# Patient Record
Sex: Female | Born: 1955 | Race: Black or African American | Hispanic: No | Smoking: Current every day smoker
Health system: Southern US, Community
[De-identification: ages and names within clinical notes are randomized; demographics above are authoritative.]

## PROBLEM LIST (undated history)

## (undated) DIAGNOSIS — I10 Essential (primary) hypertension: Secondary | ICD-10-CM

## (undated) DIAGNOSIS — F329 Major depressive disorder, single episode, unspecified: Secondary | ICD-10-CM

## (undated) DIAGNOSIS — I509 Heart failure, unspecified: Secondary | ICD-10-CM

## (undated) DIAGNOSIS — F32A Depression, unspecified: Secondary | ICD-10-CM

## (undated) DIAGNOSIS — E079 Disorder of thyroid, unspecified: Secondary | ICD-10-CM

## (undated) HISTORY — PX: HAND SURGERY: SHX662

---

## 2017-05-08 ENCOUNTER — Emergency Department
Admission: EM | Admit: 2017-05-08 | Discharge: 2017-05-08 | Disposition: A | Discharge status: Home / Self Care | Payer: No Typology Code available for payment source | Attending: Emergency Medicine | Admitting: Emergency Medicine

## 2017-05-08 ENCOUNTER — Emergency Department: Payer: No Typology Code available for payment source

## 2017-05-08 ENCOUNTER — Encounter: Payer: Self-pay | Admitting: Emergency Medicine

## 2017-05-08 DIAGNOSIS — S99912A Unspecified injury of left ankle, initial encounter: Secondary | ICD-10-CM | POA: Diagnosis present

## 2017-05-08 DIAGNOSIS — S93402A Sprain of unspecified ligament of left ankle, initial encounter: Secondary | ICD-10-CM | POA: Insufficient documentation

## 2017-05-08 DIAGNOSIS — Y998 Other external cause status: Secondary | ICD-10-CM | POA: Insufficient documentation

## 2017-05-08 DIAGNOSIS — I10 Essential (primary) hypertension: Secondary | ICD-10-CM | POA: Insufficient documentation

## 2017-05-08 DIAGNOSIS — Y92512 Supermarket, store or market as the place of occurrence of the external cause: Secondary | ICD-10-CM | POA: Insufficient documentation

## 2017-05-08 DIAGNOSIS — Y9301 Activity, walking, marching and hiking: Secondary | ICD-10-CM | POA: Diagnosis not present

## 2017-05-08 DIAGNOSIS — W010XXA Fall on same level from slipping, tripping and stumbling without subsequent striking against object, initial encounter: Secondary | ICD-10-CM | POA: Diagnosis not present

## 2017-05-08 HISTORY — DX: Essential (primary) hypertension: I10

## 2017-05-08 HISTORY — DX: Depression, unspecified: F32.A

## 2017-05-08 HISTORY — DX: Major depressive disorder, single episode, unspecified: F32.9

## 2017-05-08 NOTE — ED Provider Notes (Signed)
[facility] Emergency Department Provider Note  ____________________________________________  Time seen: Approximately 5:50 PM  I have reviewed the triage vital signs and the nursing notes.   HISTORY  Chief Complaint Ankle Injury and Fall    HPI Patricia Schneider is a 61 y.o. female that presents to the emergency department with left ankle pain after falling yesterday. Patient states that she was at [employer] when she stepped on a grape and twisted her ankle. She did not hit her head or lose consciousness. She has been walking normally but with pain on the outside of her left ankle. She denies headache, shortness breath, chest pain, nausea, vomiting, abdominal pain, numbness, tingling.   Past Medical History:  Diagnosis Date  . Depression   . Hypertension     There are no active problems to display for this patient.   No past surgical history on file.  Prior to Admission medications   Not on File    Allergies Codeine  No family history on file.  Social History Social History  Substance Use Topics  . Smoking status: Not on file  . Smokeless tobacco: Not on file  . Alcohol use Not on file     Review of Systems  Constitutional: No fever/chills Cardiovascular: No chest pain. Respiratory: No SOB. Gastrointestinal: No abdominal pain.  No nausea, no vomiting.  Musculoskeletal: Positive for ankle pain. Skin: Negative for rash, abrasions, lacerations, ecchymosis. Neurological: Negative for headaches, numbness or tingling   ____________________________________________   PHYSICAL EXAM:  VITAL SIGNS: ED Triage Vitals [05/08/17 1614]  Enc Vitals Group     BP (!) 139/91     Pulse Rate (!) 101     Resp 18     Temp 99.6 F (37.6 C)     Temp Source Oral     SpO2 97 %     Weight 200 lb (90.7 kg)     Height 5\' 4"  (1.626 m)     Head Circumference      Peak Flow      Pain Score 9     Pain Loc      Pain Edu?      Excl. in GC?       Constitutional: Alert and oriented. Well appearing and in no acute distress. Eyes: Conjunctivae are normal. PERRL. EOMI. Head: Atraumatic. ENT:      Ears:      Nose: No congestion/rhinnorhea.      Mouth/Throat: Mucous membranes are moist.  Neck: No stridor.   Cardiovascular: Normal rate, regular rhythm.  Good peripheral circulation. 2+ dorsalis pedis pulses. Respiratory: Normal respiratory effort without tachypnea or retractions. Lungs CTAB. Good air entry to the bases with no decreased or absent breath sounds. Musculoskeletal: Full range of motion to all extremities. No gross deformities appreciated. Tenderness to palpation with mild swelling over lateral malleolus. Neurologic:  Normal speech and language. No gross focal neurologic deficits are appreciated.  Skin:  Skin is warm, dry and intact. No rash noted.  ____________________________________________   LABS (all labs ordered are listed, but only abnormal results are displayed)  Labs Reviewed - No data to display ____________________________________________  EKG   ____________________________________________  RADIOLOGY Lexine Baton, personally viewed and evaluated these images (plain radiographs) as part of my medical decision making, as well as reviewing the written report by the radiologist.  Dg Ankle Complete Left  Result Date: 05/08/2017 CLINICAL DATA:  Left ankle pain after fall yesterday. EXAM: LEFT ANKLE COMPLETE - 3+ VIEW COMPARISON:  None. FINDINGS: There is no evidence of fracture, dislocation, or joint effusion. There is no evidence of arthropathy or other focal bone abnormality. Soft tissue swelling is seen over the lateral malleolus. IMPRESSION: No fracture or dislocation is noted. Soft tissue swelling seen over lateral malleolus suggesting ligamentous injury. Electronically Signed   By: Lupita RaiderJames  Green Jr, M.D.   On: 05/08/2017 16:52     ____________________________________________    PROCEDURES  Procedure(s) performed:    Procedures    Medications - No data to display   ____________________________________________   INITIAL IMPRESSION / ASSESSMENT AND PLAN / ED COURSE  Pertinent labs & imaging results that were available during my care of the patient were reviewed by me and considered in my medical decision making (see chart for details).  Review of the [place] CSRS was performed in accordance of the [facility] prior to dispensing any controlled drugs.     Patient's diagnosis is consistent with ankle sprain. Vital signs and exam are reassuring. X-ray negative for acute bony abnormalities. She was up walking around the room. Ankle was wrapped and crutches were given. Patient is to follow up with orthopedics as directed. Patient is given ED precautions to return to the ED for any worsening or new symptoms.     ____________________________________________  FINAL CLINICAL IMPRESSION(S) / ED DIAGNOSES  Final diagnoses:  Sprain of left ankle, unspecified ligament, initial encounter      NEW MEDICATIONS STARTED DURING THIS VISIT:  There are no discharge medications for this patient.       This chart was dictated using voice recognition software/Dragon. Despite best efforts to proofread, errors can occur which can change the meaning. Any change was purely unintentional.    Enid DerryWagner, Giomar Gusler, PA-C 05/08/17 2030    Arnaldo NatalMalinda, Paul F, MD 05/08/17 [phone]

## 2017-05-08 NOTE — ED Triage Notes (Signed)
Pt reports [place] on a grape yesterday at [employer] and injured left ankle. Pt ambulatory to triage. Swelling noted to left ankle.

## 2017-05-25 ENCOUNTER — Emergency Department
Admission: EM | Admit: 2017-05-25 | Discharge: 2017-05-25 | Disposition: A | Discharge status: Home / Self Care | Payer: Self-pay | Attending: Emergency Medicine | Admitting: Emergency Medicine

## 2017-05-25 ENCOUNTER — Emergency Department: Payer: Self-pay

## 2017-05-25 ENCOUNTER — Encounter: Payer: Self-pay | Admitting: Emergency Medicine

## 2017-05-25 DIAGNOSIS — W540XXA Bitten by dog, initial encounter: Secondary | ICD-10-CM | POA: Insufficient documentation

## 2017-05-25 DIAGNOSIS — S81851A Open bite, right lower leg, initial encounter: Secondary | ICD-10-CM

## 2017-05-25 DIAGNOSIS — I10 Essential (primary) hypertension: Secondary | ICD-10-CM | POA: Insufficient documentation

## 2017-05-25 DIAGNOSIS — Y999 Unspecified external cause status: Secondary | ICD-10-CM | POA: Insufficient documentation

## 2017-05-25 DIAGNOSIS — S40011A Contusion of right shoulder, initial encounter: Secondary | ICD-10-CM | POA: Insufficient documentation

## 2017-05-25 DIAGNOSIS — F17200 Nicotine dependence, unspecified, uncomplicated: Secondary | ICD-10-CM | POA: Insufficient documentation

## 2017-05-25 DIAGNOSIS — Y929 Unspecified place or not applicable: Secondary | ICD-10-CM | POA: Insufficient documentation

## 2017-05-25 DIAGNOSIS — S7011XA Contusion of right thigh, initial encounter: Secondary | ICD-10-CM | POA: Insufficient documentation

## 2017-05-25 DIAGNOSIS — Y939 Activity, unspecified: Secondary | ICD-10-CM | POA: Insufficient documentation

## 2017-05-25 DIAGNOSIS — S7001XA Contusion of right hip, initial encounter: Secondary | ICD-10-CM | POA: Insufficient documentation

## 2017-05-25 DIAGNOSIS — W010XXA Fall on same level from slipping, tripping and stumbling without subsequent striking against object, initial encounter: Secondary | ICD-10-CM | POA: Insufficient documentation

## 2017-05-25 DIAGNOSIS — Z23 Encounter for immunization: Secondary | ICD-10-CM | POA: Insufficient documentation

## 2017-05-25 MED ORDER — TETANUS-DIPHTH-ACELL PERTUSSIS 5-2.5-18.5 LF-MCG/0.5 IM SUSP
0.5000 mL | Freq: Once | INTRAMUSCULAR | Status: AC
  Administered 2017-05-25: 0.5 mL via INTRAMUSCULAR
  Filled 2017-05-25: qty 0.5

## 2017-05-25 MED ORDER — NAPROXEN 500 MG PO TABS: 500.0000 mg | ORAL_TABLET | Freq: Two times a day (BID) | ORAL | 0 refills | Status: DC

## 2017-05-25 MED ORDER — CEPHALEXIN 500 MG PO CAPS: 500.0000 mg | ORAL_CAPSULE | Freq: Three times a day (TID) | ORAL | 0 refills | Status: DC

## 2017-05-25 NOTE — Discharge Instructions (Signed)
Follow-up with your primary care provider or [facility] if any continued problems with your dog bite or joint pain. Clean area daily with mild soap and water and watch for signs of infection. Begin taking Keflex 500 mg 3 times a day for 7 days to prevent infection and naproxen 500 mg twice a day with food for pain and inflammation. You were given a tetanus booster  today.  We've information on animal bites and watch for signs of infection.

## 2017-05-25 NOTE — ED Triage Notes (Signed)
Pt c/o right shoulder pain after falling 5 days ago.  Pain when raising arm.  Also c/o of some pain in right hip. Ambulatory to first desk without difficulty and walking around lobby while waiting to check in. NAD. No obvious deformity to shoulder.

## 2017-05-25 NOTE — ED Provider Notes (Signed)
[facility] Emergency Department Provider Note  ____________________________________________   First MD Initiated Contact with Patient 05/25/17 [phone]     (approximate)  I have reviewed the triage vital signs and the nursing notes.   HISTORY  Chief Complaint Shoulder Pain   HPI Patricia Schneider is a 61 y.o. female is here with complaint of right shoulder pain and right hip pain after falling proximally 5 days ago when she was bit by a pit bull on her right lower leg. Patient did not report the dog bite at that time. She states that she does not know the people who owns the dog that was told that it was up-to-date on immunizations. She has not taken any over-the-counter medication for pain because "I don't want to be addicted". Patient denies any head injury or loss of consciousness. She is unsure of her last tetanus update. She denies any drainage or redness at the site of her dog bite. Currently she rates her pain as 10 over 10.   Past Medical History:  Diagnosis Date  . Depression   . Hypertension     There are no active problems to display for this patient.   History reviewed. No pertinent surgical history.  Prior to Admission medications   Medication Sig Start Date End Date Taking? Authorizing Provider  cephALEXin (KEFLEX) 500 MG capsule Take 1 capsule (500 mg total) by mouth 3 (three) times daily. 05/25/17   Tommi RumpsSummers, Rhonda L, PA-C  naproxen (NAPROSYN) 500 MG tablet Take 1 tablet (500 mg total) by mouth 2 (two) times daily with a meal. 05/25/17   Tommi RumpsSummers, Rhonda L, PA-C    Allergies Codeine  History reviewed. No pertinent family history.  Social History Social History  Substance Use Topics  . Smoking status: Current Every Day Smoker  . Smokeless tobacco: Never Used  . Alcohol use No    Review of Systems Constitutional: No fever/chills Cardiovascular: Denies chest pain. Respiratory: Denies shortness of breath. Musculoskeletal: Positive  for right shoulder pain, positive for right hip pain Skin: Positive for dog bite right lower leg. Neurological: Negative for headaches, focal weakness or numbness.   ____________________________________________   PHYSICAL EXAM:  VITAL SIGNS: ED Triage Vitals  Enc Vitals Group     BP 05/25/17 0837 (!) 160/108     Pulse Rate 05/25/17 0837 89     Resp 05/25/17 0837 18     Temp 05/25/17 0837 98.3 F (36.8 C)     Temp Source 05/25/17 0837 Oral     SpO2 05/25/17 0837 97 %     Weight 05/25/17 0835 200 lb (90.7 kg)     Height 05/25/17 0835 5\' 4"  (1.626 m)     Head Circumference --      Peak Flow --      Pain Score 05/25/17 0835 10     Pain Loc --      Pain Edu? --      Excl. in GC? --     Constitutional: Alert and oriented. Well appearing and in no acute distress. Eyes: Conjunctivae are normal.  Head: Atraumatic. Neck: No stridor.   Cardiovascular: Normal rate, regular rhythm. Grossly normal heart sounds.  Good peripheral circulation. Respiratory: Normal respiratory effort.  No retractions. Lungs CTAB. Musculoskeletal: On examination the right shoulder there is no gross deformity. No soft tissue swelling is appreciated. Range of motion there is no crepitus better range of motion is slightly restricted secondary to discomfort. No ecchymosis or abrasions were seen. There is  generalized tenderness both anterior and posterior aspect. Nontender elbow and forearm. On examination of the right hip there is generalized tenderness on palpation of the hip and proximal femur. Range of motion is without restriction. No gross deformity was noted. There is no tenderness or edema appreciated of the knee.  Neurologic:  Normal speech and language. No gross focal neurologic deficits are appreciated. No gait instability. Skin:  Skin is warm, dry.  Posterior calf there are 2 puncture marks without any evidence of infection. There is some ecchymosis surrounding the area. No evidence of foreign body  noted. Psychiatric: Mood and affect are normal. Speech and behavior are normal.  ____________________________________________   LABS (all labs ordered are listed, but only abnormal results are displayed)  Labs Reviewed - No data to display  RADIOLOGY  Dg Shoulder Right  Result Date: 05/25/2017 CLINICAL DATA:  Right shoulder pain. Fall 3 days ago. Initial encounter. EXAM: RIGHT SHOULDER - 2+ VIEW COMPARISON:  None. FINDINGS: No acute fracture or dislocation is identified. There is mild acromioclavicular joint osteoarthrosis. Minimal glenohumeral marginal spurring is also noted. No significant soft tissue abnormalities are seen. IMPRESSION: Mild degenerative changes without evidence of acute osseous abnormality. Electronically Signed   By: Sebastian Ache M.D.   On: 05/25/2017 10:12   Dg Hip Unilat W Or Wo Pelvis 2-3 Views Right  Result Date: 05/25/2017 CLINICAL DATA:  Fall.  Pain EXAM: DG HIP (WITH OR WITHOUT PELVIS) 2-3V RIGHT COMPARISON:  None. FINDINGS: Mild degenerative changes in the hips bilaterally with joint space narrowing and spurring, right slightly greater than left. SI joints are symmetric and unremarkable. No acute bony abnormality. Specifically, no fracture, subluxation, or dislocation. Soft tissues are intact. IMPRESSION: No acute bony abnormality. Electronically Signed   By: Charlett Nose M.D.   On: 05/25/2017 10:11    ____________________________________________   PROCEDURES  Procedure(s) performed: None  Procedures  Critical Care performed: No  ____________________________________________   INITIAL IMPRESSION / ASSESSMENT AND PLAN / ED COURSE  Pertinent labs & imaging results that were available during my care of the patient were reviewed by me and considered in my medical decision making (see chart for details).  Patient was reluctant to give any further information about the dog bite and location. [place] [employer] took a statement from the patient. She states that  when did it and she asked the owner if the dog was up-to-date on immunizations and "I believe him". Tetanus update was done. Patient was discharged with prescription for Keflex 500 mg 3 times a day for 7 days and naproxen 500 mg twice a day for pain and inflammation. She was reassured that naproxen is not addictive. She is to continue cleaning dog bite with mild soap and water and watch for signs of infection. She will follow-up with her PCP or [facility]  clinic if any continued problems.       ____________________________________________   FINAL CLINICAL IMPRESSION(S) / ED DIAGNOSES  Final diagnoses:  Contusion of right shoulder, initial encounter  Contusion of right hip and thigh, initial encounter  Dog bite of right lower leg, initial encounter      NEW MEDICATIONS STARTED DURING THIS VISIT:  New Prescriptions   CEPHALEXIN (KEFLEX) 500 MG CAPSULE    Take 1 capsule (500 mg total) by mouth 3 (three) times daily.   NAPROXEN (NAPROSYN) 500 MG TABLET    Take 1 tablet (500 mg total) by mouth 2 (two) times daily with a meal.     Note:  This document  was prepared using Conservation officer, historic buildingsDragon voice recognition software and may include unintentional dictation errors.    Tommi RumpsSummers, Rhonda L, PA-C 05/25/17 1133    Jeanmarie PlantMcShane, James A, MD 05/25/17 [phone]

## 2017-05-25 NOTE — ED Notes (Signed)
[employer] to bedside at this time.

## 2017-05-25 NOTE — ED Notes (Signed)
Pt reports dog bite by pit bull x four days ago in addition to chief complaint.  Pt states incident was not reported, states, "the dog was up to date on its shots" per the dog's owners.  [employer] notified.

## 2017-07-28 ENCOUNTER — Encounter: Payer: Self-pay | Admitting: Emergency Medicine

## 2017-07-28 ENCOUNTER — Emergency Department: Payer: Non-veteran care

## 2017-07-28 DIAGNOSIS — F17200 Nicotine dependence, unspecified, uncomplicated: Secondary | ICD-10-CM | POA: Insufficient documentation

## 2017-07-28 DIAGNOSIS — R0789 Other chest pain: Secondary | ICD-10-CM | POA: Insufficient documentation

## 2017-07-28 DIAGNOSIS — I10 Essential (primary) hypertension: Secondary | ICD-10-CM | POA: Insufficient documentation

## 2017-07-28 DIAGNOSIS — R079 Chest pain, unspecified: Secondary | ICD-10-CM | POA: Diagnosis present

## 2017-07-28 LAB — BASIC METABOLIC PANEL
ANION GAP: 7 (ref 5–15)
BUN: 18 mg/dL (ref 6–20)
CALCIUM: 9.1 mg/dL (ref 8.9–10.3)
CO2: 27 mmol/L (ref 22–32)
CREATININE: 0.76 mg/dL (ref 0.44–1.00)
Chloride: 102 mmol/L (ref 101–111)
GFR calc Af Amer: >60 mL/min (ref >60)
Glucose, Bld: 112 mg/dL — ABNORMAL HIGH (ref 65–99)
Potassium: 3.7 mmol/L (ref 3.5–5.1)
SODIUM: 136 mmol/L (ref 135–145)

## 2017-07-28 LAB — CBC
HCT: 35.2 % (ref 35.0–47.0)
HEMOGLOBIN: 11.8 g/dL — AB (ref 12.0–16.0)
MCH: 28.2 pg (ref 26.0–34.0)
MCHC: 33.4 g/dL (ref 32.0–36.0)
MCV: 84.4 fL (ref 80.0–100.0)
PLATELETS: 268 10*3/uL (ref 150–440)
RBC: 4.16 MIL/uL (ref 3.80–5.20)
RDW: 15.3 % — ABNORMAL HIGH (ref 11.5–14.5)
WBC: 14.4 10*3/uL — AB (ref 3.6–11.0)

## 2017-07-28 LAB — TROPONIN I

## 2017-07-28 NOTE — ED Triage Notes (Signed)
Patient to ER for c/o left sided chest pain that radiates to back. States pain began suddenly at approx 1600 this afternoon. States she did have intermittent chest pain last week that was also present under left breast. Denies any known heart history or history of abnormal EKG.

## 2017-07-29 ENCOUNTER — Emergency Department
Admission: EM | Admit: 2017-07-29 | Discharge: 2017-07-29 | Disposition: A | Discharge status: Home / Self Care | Payer: Non-veteran care | Attending: Emergency Medicine | Admitting: Emergency Medicine

## 2017-07-29 DIAGNOSIS — R079 Chest pain, unspecified: Secondary | ICD-10-CM

## 2017-07-29 LAB — TROPONIN I

## 2017-07-29 LAB — FIBRIN DERIVATIVES D-DIMER (ARMC ONLY): Fibrin derivatives D-dimer (ARMC): 417.09 ng/mL (FEU) (ref 0.00–499.00)

## 2017-07-29 MED ORDER — KETOROLAC TROMETHAMINE 30 MG/ML IJ SOLN
10.0000 mg | Freq: Once | INTRAMUSCULAR | Status: DC
  Filled 2017-07-29: qty 1

## 2017-07-29 NOTE — ED Notes (Signed)
Pt states that she had an argument with sister and then she started having chest and back pains. States that they leave and come back.

## 2017-07-29 NOTE — Discharge Instructions (Signed)
Return to the ER for worsening symptoms, persistent vomiting, difficulty breathing or other concerns. °

## 2017-07-29 NOTE — ED Provider Notes (Signed)
[facility] Emergency Department Provider Note   ____________________________________________   First MD Initiated Contact with Patient 07/29/17 [phone]     (approximate)  I have reviewed the triage vital signs and the nursing notes.   HISTORY  Chief Complaint Chest Pain    HPI Patricia Schneider is a 61 y.o. female who presents to the ED from home with a chief complaint of chest pain. Patient reports left-sided chest pain radiating to her back onset at 4 PM yesterday afternoon. Describes waxing/waning chest discomfort exacerbated by arguing with her sister. Denies associated diaphoresis, shortness of breath, nauseous as vomiting, palpitations or dizziness. Denies abdominal pain,dysuria, diarrhea. Denies recent travel or trauma. Denies hormone use. Nothing makes her symptoms better or worse.   Past Medical History:  Diagnosis Date  . Depression   . Hypertension     There are no active problems to display for this patient.   History reviewed. No pertinent surgical history.  Prior to Admission medications   Medication Sig Start Date End Date Taking? Authorizing Provider  cephALEXin (KEFLEX) 500 MG capsule Take 1 capsule (500 mg total) by mouth 3 (three) times daily. 05/25/17   Tommi Rumps, PA-C  naproxen (NAPROSYN) 500 MG tablet Take 1 tablet (500 mg total) by mouth 2 (two) times daily with a meal. 05/25/17   Tommi Rumps, PA-C    Allergies Codeine  No family history on file.  Social History Social History  Substance Use Topics  . Smoking status: Current Every Day Smoker  . Smokeless tobacco: Never Used  . Alcohol use No    Review of Systems  Constitutional: No fever/chills. Eyes: No visual changes. ENT: No sore throat. Cardiovascular: positive for chest pain. Respiratory: Denies shortness of breath. Gastrointestinal: No abdominal pain.  No nausea, no vomiting.  No diarrhea.  No constipation. Genitourinary: Negative for  dysuria. Musculoskeletal: Negative for back pain. Skin: Negative for rash. Neurological: Negative for headaches, focal weakness or numbness.   ____________________________________________   PHYSICAL EXAM:  VITAL SIGNS: ED Triage Vitals  Enc Vitals Group     BP 07/28/17 2302 137/84     Pulse Rate 07/28/17 2302 83     Resp 07/28/17 2302 20     Temp 07/28/17 2302 98.4 F (36.9 C)     Temp Source 07/28/17 2302 Oral     SpO2 07/28/17 2302 97 %     Weight 07/28/17 2303 200 lb (90.7 kg)     Height 07/28/17 2303  (1.626 m)     Head Circumference --      Peak Flow --      Pain Score 07/28/17 2302 8     Pain Loc --      Pain Edu? --      Excl. in GC? --     Constitutional: Alert and oriented. Well appearing and in no acute distress. Eyes: Conjunctivae are normal. PERRL. EOMI. Head: Atraumatic. Nose: No congestion/rhinnorhea. Mouth/Throat: Mucous membranes are moist.  Oropharynx non-erythematous. Neck: No stridor.  No carotid bruits. Cardiovascular: Normal rate, regular rhythm. Grossly normal heart sounds.  Good peripheral circulation. Respiratory: Normal respiratory effort.  No retractions. Lungs CTAB. Anterior chest wall tender to palpation. Gastrointestinal: Soft and nontender. No distention. No abdominal bruits. No CVA tenderness. Musculoskeletal: No lower extremity tenderness nor edema.  No joint effusions. Neurologic:  Normal speech and language. No gross focal neurologic deficits are appreciated. No gait instability. Skin:  Skin is warm, dry and intact. No rash noted. Psychiatric:  Mood and affect are normal. Speech and behavior are normal.  ____________________________________________   LABS (all labs ordered are listed, but only abnormal results are displayed)  Labs Reviewed  BASIC METABOLIC PANEL - Abnormal; Notable for the following:       Result Value   Glucose, Bld 112 (*)    All other components within normal limits  CBC - Abnormal; Notable for the  following:    WBC 14.4 (*)    Hemoglobin 11.8 (*)    RDW 15.3 (*)    All other components within normal limits  TROPONIN I  TROPONIN I  FIBRIN DERIVATIVES D-DIMER ([facility] ONLY)   ____________________________________________  EKG  ED ECG REPORT I, Flynn Gwyn J, the attending physician, personally viewed and interpreted this ECG.   Date: 07/29/2017  EKG Time: 2248  Rate: 82  Rhythm: normal EKG, normal sinus rhythm  Axis: LAD  Intervals:right bundle branch block  ST&T Change: nonspecific  ____________________________________________  RADIOLOGY  Dg Chest 2 View  Result Date: 07/28/2017 CLINICAL DATA:  Chest pain EXAM: CHEST  2 VIEW COMPARISON:  None. FINDINGS: Mild bronchitic changes. No consolidation or effusion. Heart size upper normal. No pneumothorax. IMPRESSION: Mild bronchitic changes.  No focal infiltrate or edema. Electronically Signed   By: Jasmine Pang M.D.   On: 07/28/2017 23:36    ____________________________________________   PROCEDURES  Procedure(s) performed: None  Procedures  Critical Care performed: No  ____________________________________________   INITIAL IMPRESSION / ASSESSMENT AND PLAN / ED COURSE  Pertinent labs & imaging results that were available during my care of the patient were reviewed by me and considered in my medical decision making (see chart for details).  61 year old female who presents with intermittent chest pain x 22 hours, worsened after argument with sister. Differential diagnosis includes, but is not limited to, ACS, aortic dissection, pulmonary embolism, cardiac tamponade, pneumothorax, pneumonia, pericarditis/myocarditis, GI-related causes including esophagitis/gastritis, and musculoskeletal chest wall pain.  Low suspicion for ACS, PE, dissection. Will check repeat, timed troponin, d-dimer, administer NSAIDs for pain and reassess.  Clinical Course as of Jul 29 326  Tue Jul 29, 2017  1610 Patient improved; updated her on  repeat negative troponin and d-dimer results. Strict return precautions given. Patient verbalizes understanding and agrees with plan of care.  [JS]    Clinical Course User Index [JS] Irean Hong, MD     ____________________________________________   FINAL CLINICAL IMPRESSION(S) / ED DIAGNOSES  Final diagnoses:  Nonspecific chest pain      NEW MEDICATIONS STARTED DURING THIS VISIT:  New Prescriptions   No medications on file     Note:  This document was prepared using Dragon voice recognition software and may include unintentional dictation errors.    Irean Hong, MD 07/29/17 [phone]

## 2017-08-04 ENCOUNTER — Telehealth: Payer: Self-pay

## 2017-08-04 NOTE — Telephone Encounter (Signed)
Called patient to make an  ED fu visit with office Patient stated she did not need a follow up Nothing else needed.

## 2017-11-25 ENCOUNTER — Encounter: Payer: Self-pay | Admitting: Emergency Medicine

## 2017-11-25 ENCOUNTER — Emergency Department
Admission: EM | Admit: 2017-11-25 | Discharge: 2017-11-25 | Disposition: A | Discharge status: Home / Self Care | Payer: Non-veteran care | Attending: Emergency Medicine | Admitting: Emergency Medicine

## 2017-11-25 ENCOUNTER — Other Ambulatory Visit: Payer: Self-pay

## 2017-11-25 DIAGNOSIS — F17200 Nicotine dependence, unspecified, uncomplicated: Secondary | ICD-10-CM | POA: Insufficient documentation

## 2017-11-25 DIAGNOSIS — J069 Acute upper respiratory infection, unspecified: Secondary | ICD-10-CM | POA: Diagnosis not present

## 2017-11-25 DIAGNOSIS — Z79899 Other long term (current) drug therapy: Secondary | ICD-10-CM | POA: Diagnosis not present

## 2017-11-25 DIAGNOSIS — I10 Essential (primary) hypertension: Secondary | ICD-10-CM | POA: Insufficient documentation

## 2017-11-25 DIAGNOSIS — B9789 Other viral agents as the cause of diseases classified elsewhere: Secondary | ICD-10-CM | POA: Insufficient documentation

## 2017-11-25 DIAGNOSIS — R070 Pain in throat: Secondary | ICD-10-CM | POA: Diagnosis present

## 2017-11-25 LAB — INFLUENZA PANEL BY PCR (TYPE A & B)
Influenza A By PCR: NEGATIVE
Influenza B By PCR: NEGATIVE

## 2017-11-25 MED ORDER — AZITHROMYCIN 250 MG PO TABS: ORAL_TABLET | ORAL | 0 refills | Status: DC

## 2017-11-25 MED ORDER — BENZONATATE 100 MG PO CAPS: 100.0000 mg | ORAL_CAPSULE | Freq: Three times a day (TID) | ORAL | 0 refills | Status: DC | PRN

## 2017-11-25 MED ORDER — FLUTICASONE PROPIONATE 50 MCG/ACT NA SUSP: 2.0000 | Freq: Every day | NASAL | 0 refills | Status: AC

## 2017-11-25 MED ORDER — IPRATROPIUM-ALBUTEROL 0.5-2.5 (3) MG/3ML IN SOLN
3.0000 mL | Freq: Once | RESPIRATORY_TRACT | Status: AC
  Administered 2017-11-25: 3 mL via RESPIRATORY_TRACT
  Filled 2017-11-25: qty 3

## 2017-11-25 NOTE — ED Provider Notes (Signed)
[facility] Emergency Department Provider Note ____________________________________________  Time seen: 1118  I have reviewed the triage vital signs and the nursing notes.  HISTORY  Chief Complaint  Sore Throat  HPI Patricia Schneider is a 62 y.o. female presents herself to the ED for evaluation of cough and sore throat.  Patient describes intermittent nonproductive cough that is been present for 2-3 days.  She does admit that she had some subjective fevers earlier in the week.  She denies receiving a seasonal flu vaccine.  Patient denies any chest pain but does note some mild shortness of breath.  She denies any cough-induced vomiting, abdominal pain, or diarrhea.  She is concerned that she might have had strep throat, noting some generalized sore throat as well as some fullness around the lymph nodes.  Past Medical History:  Diagnosis Date  . Depression   . Hypertension     There are no active problems to display for this patient.  History reviewed. No pertinent surgical history.  Prior to Admission medications   Medication Sig Start Date End Date Taking? Authorizing Provider  amLODipine (NORVASC) 10 MG tablet Take 10 mg by mouth daily.   Yes [provider]  azithromycin (ZITHROMAX Z-PAK) 250 MG tablet Take 2 tablets (500 mg) on  Day 1,  followed by 1 tablet (250 mg) once daily on Days 2 through 5. 11/25/17   Tasmine Hipwell, Charlesetta Ivory, PA-C  benzonatate (TESSALON PERLES) 100 MG capsule Take 1 capsule (100 mg total) by mouth 3 (three) times daily as needed for cough (Take 1-2 per dose). 11/25/17   Braeden Dolinski, Charlesetta Ivory, PA-C  cephALEXin (KEFLEX) 500 MG capsule Take 1 capsule (500 mg total) by mouth 3 (three) times daily. 05/25/17   Tommi Rumps, PA-C  fluticasone (FLONASE) 50 MCG/ACT nasal spray Place 2 sprays into both nostrils daily. 11/25/17   Terrence Pizana, Charlesetta Ivory, PA-C  naproxen (NAPROSYN) 500 MG tablet Take 1 tablet (500 mg total) by mouth 2  (two) times daily with a meal. 05/25/17   Tommi Rumps, PA-C   Allergies Codeine  No family history on file.  Social History Social History   Tobacco Use  . Smoking status: Current Every Day Smoker  . Smokeless tobacco: Never Used  Substance Use Topics  . Alcohol use: No  . Drug use: No   Review of Systems  Constitutional: Negative for fever. Eyes: Negative for visual changes, redness or irritation. ENT: Positive for sore throat. Cardiovascular: Negative for chest pain. Respiratory: Negative for shortness of breath. Reports non-productive cough.  Gastrointestinal: Negative for abdominal pain, vomiting and diarrhea. Musculoskeletal: Negative for back pain. ____________________________________________  PHYSICAL EXAM:  VITAL SIGNS: ED Triage Vitals  Enc Vitals Group     BP 11/25/17 1059 (!) 159/97     Pulse Rate 11/25/17 1059 81     Resp 11/25/17 1059 17     Temp 11/25/17 1059 98.4 F (36.9 C)     Temp Source 11/25/17 1059 Oral     SpO2 11/25/17 1059 97 %     Weight 11/25/17 1056 200 lb (90.7 kg)     Height --      Head Circumference --      Peak Flow --      Pain Score 11/25/17 1056 10     Pain Loc --      Pain Edu? --      Excl. in GC? --     Constitutional: Alert and oriented. Well appearing  and in no distress. Head: Normocephalic and atraumatic. Eyes: Conjunctivae are normal. PERRL. Normal extraocular movements Ears: Canals clear. TMs intact bilaterally. Nose: No congestion/rhinorrhea/epistaxis.  Turbinates are enlarged. Mouth/Throat: Mucous membranes are moist.  Uvula is midline and tonsils are flat.  No oropharyngeal lesions are appreciated.  Generalized oropharyngeal erythema is noted. Neck: Supple. No thyromegaly. Hematological/Lymphatic/Immunological: No cervical lymphadenopathy. Cardiovascular: Normal rate, regular rhythm. Normal distal pulses. Respiratory: Normal respiratory effort. No wheezes/rales/rhonchi. Gastrointestinal: Soft and nontender.  No distention. ____________________________________________   LABS (pertinent positives/negatives) Labs Reviewed  INFLUENZA PANEL BY PCR (TYPE A & B)  ____________________________________________  PROCEDURES  DuoNeb x 1 _____________________________________________  INITIAL IMPRESSION / ASSESSMENT AND PLAN / ED COURSE  Patient with ED evaluation of sore throat and cough.  Patient exam is overall benign.  She does have some mild rhonchi noted bilaterally.  No indication of strep pharyngitis on clinical exam and her influenza screen is negative.  She will be discharged however, with a prescription for a azithromycin which will cover for any potential community-acquired pneumonia or early bronchitis.  We will also give her coverage in case she has a subclinical strep pharyngitis at this time.  She is discharged with prescriptions for Tessalon Perles, Flonase, and azithromycin.  She will follow-up with her provider [facility] for ongoing symptom management.  Return precautions have been reviewed. ____________________________________________  FINAL CLINICAL IMPRESSION(S) / ED DIAGNOSES  Final diagnoses:  Viral URI with cough      Koreen Lizaola, Charlesetta IvoryJenise V Bacon, PA-C 11/25/17 1805    Emily FilbertWilliams, Jonathan E, MD 11/26/17 [phone]

## 2017-11-25 NOTE — ED Notes (Signed)
See triage note  States for the past 3 days she developed sore throat and cough  States cough is prod ..unsure of fever

## 2017-11-25 NOTE — Discharge Instructions (Signed)
Take the prescription meds as directed. Follow-up with your provider for continued symptoms. Drink plenty of fluids to prevent dehydration.

## 2017-11-25 NOTE — ED Triage Notes (Signed)
Pt with sore throat and cough 

## 2020-01-16 ENCOUNTER — Emergency Department: Payer: No Typology Code available for payment source

## 2020-01-16 ENCOUNTER — Other Ambulatory Visit: Payer: Self-pay

## 2020-01-16 ENCOUNTER — Encounter: Payer: Self-pay | Admitting: Emergency Medicine

## 2020-01-16 ENCOUNTER — Emergency Department
Admission: EM | Admit: 2020-01-16 | Discharge: 2020-01-16 | Discharge status: Against Medical Advice | Payer: No Typology Code available for payment source | Attending: Emergency Medicine | Admitting: Emergency Medicine

## 2020-01-16 DIAGNOSIS — Z79899 Other long term (current) drug therapy: Secondary | ICD-10-CM | POA: Diagnosis not present

## 2020-01-16 DIAGNOSIS — R200 Anesthesia of skin: Secondary | ICD-10-CM | POA: Insufficient documentation

## 2020-01-16 DIAGNOSIS — F17200 Nicotine dependence, unspecified, uncomplicated: Secondary | ICD-10-CM | POA: Diagnosis not present

## 2020-01-16 DIAGNOSIS — E876 Hypokalemia: Secondary | ICD-10-CM | POA: Diagnosis not present

## 2020-01-16 DIAGNOSIS — D72829 Elevated white blood cell count, unspecified: Secondary | ICD-10-CM

## 2020-01-16 DIAGNOSIS — I10 Essential (primary) hypertension: Secondary | ICD-10-CM | POA: Insufficient documentation

## 2020-01-16 LAB — COMPREHENSIVE METABOLIC PANEL
ALT: 24 U/L (ref 0–44)
AST: 21 U/L (ref 15–41)
Albumin: 3.8 g/dL (ref 3.5–5.0)
Alkaline Phosphatase: 99 U/L (ref 38–126)
Anion gap: 9 (ref 5–15)
BUN: 11 mg/dL (ref 8–23)
CO2: 31 mmol/L (ref 22–32)
Calcium: 8.8 mg/dL — ABNORMAL LOW (ref 8.9–10.3)
Chloride: 101 mmol/L (ref 98–111)
Creatinine, Ser: 0.92 mg/dL (ref 0.44–1.00)
GFR calc Af Amer: >60 mL/min (ref >60)
GFR calc non Af Amer: >60 mL/min (ref >60)
Glucose, Bld: 148 mg/dL — ABNORMAL HIGH (ref 70–99)
Potassium: 2.9 mmol/L — ABNORMAL LOW (ref 3.5–5.1)
Sodium: 141 mmol/L (ref 135–145)
Total Bilirubin: 0.7 mg/dL (ref 0.3–1.2)
Total Protein: 7.2 g/dL (ref 6.5–8.1)

## 2020-01-16 LAB — CBC WITH DIFFERENTIAL/PLATELET
Abs Immature Granulocytes: 0.07 10*3/uL (ref 0.00–0.07)
Basophils Absolute: 0 10*3/uL (ref 0.0–0.1)
Basophils Relative: 0 %
Eosinophils Absolute: 0.2 10*3/uL (ref 0.0–0.5)
Eosinophils Relative: 1 %
HCT: 44.6 % (ref 36.0–46.0)
Hemoglobin: 13.9 g/dL (ref 12.0–15.0)
Immature Granulocytes: 0 %
Lymphocytes Relative: 26 %
Lymphs Abs: 5 10*3/uL — ABNORMAL HIGH (ref 0.7–4.0)
MCH: 27.1 pg (ref 26.0–34.0)
MCHC: 31.2 g/dL (ref 30.0–36.0)
MCV: 86.9 fL (ref 80.0–100.0)
Monocytes Absolute: 0.8 10*3/uL (ref 0.1–1.0)
Monocytes Relative: 4 %
Neutro Abs: 13.2 10*3/uL — ABNORMAL HIGH (ref 1.7–7.7)
Neutrophils Relative %: 69 %
Platelets: 294 10*3/uL (ref 150–400)
RBC: 5.13 MIL/uL — ABNORMAL HIGH (ref 3.87–5.11)
RDW: 21.1 % — ABNORMAL HIGH (ref 11.5–15.5)
Smear Review: NORMAL
WBC: 19.4 10*3/uL — ABNORMAL HIGH (ref 4.0–10.5)
nRBC: 0 % (ref 0.0–0.2)

## 2020-01-16 MED ORDER — POTASSIUM CHLORIDE CRYS ER 20 MEQ PO TBCR
40.0000 meq | EXTENDED_RELEASE_TABLET | Freq: Once | ORAL | Status: AC
  Administered 2020-01-16: 40 meq via ORAL
  Filled 2020-01-16: qty 2

## 2020-01-16 NOTE — ED Notes (Signed)
Dr. Silverio Lay informed pt walked out

## 2020-01-16 NOTE — ED Notes (Signed)
Pt walks out of room dressed and leaves. Pt had previously requested for extra meal tray and this RN stated that I would check. Pt did not stop to talk to nurse or state why she was leaving. No IVs present.

## 2020-01-16 NOTE — ED Triage Notes (Addendum)
Pt to ED via POV stating that she has been having numbness in her right arm and right and left great toe for the past few weeks. Pt states that she thought she had a pinched nerve when symptoms first started so she did not seek care. Pt states that a few times over the past week or so she has noticed that her right arm gets very cold. Pt is in NAD at this time.

## 2020-01-16 NOTE — ED Provider Notes (Signed)
[facility] EMERGENCY DEPARTMENT Provider Note   CSN: [id number] Arrival date & time: 01/16/20  1421     History Chief Complaint  Patient presents with  . Numbness    Patricia Schneider is a 64 y.o. female history of hypertension, depression, here presenting with numbness.  Patient states that she has been having numbness in the right 4th-5th finger as well as numbness of bilateral big toes the last 2 weeks or so.  Patient states that when she was taking care of her mother several years ago, she has been lifting her and may have some nerve injury.  She never saw her doctor so never had any imaging of her Schneider.  Patient states that the symptoms did not resolve so she came in for evaluation.  Patient denies any trouble speaking or weakness.  Patient denies any vascular problems.  He is not currently on blood thinners.  Has no history of stroke in the past.  Denies any Schneider pain.  The history is provided by the patient.       Past Medical History:  Diagnosis Date  . Depression   . Hypertension     There are no problems to display for this patient.   History reviewed. No pertinent surgical history.   OB History   No obstetric history on file.     No family history on file.  Social History   Tobacco Use  . Smoking status: Current Every Day Smoker  . Smokeless tobacco: Never Used  Substance Use Topics  . Alcohol use: No  . Drug use: No    Home Medications Prior to Admission medications   Medication Sig Start Date End Date Taking? Authorizing Provider  amLODipine (NORVASC) 10 MG tablet Take 10 mg by mouth daily.    [provider]  azithromycin (ZITHROMAX Z-PAK) 250 MG tablet Take 2 tablets (500 mg) on  Day 1,  followed by 1 tablet (250 mg) once daily on Days 2 through 5. 11/25/17   Patricia Schneider, Patricia Ivory, PA-C  benzonatate (TESSALON PERLES) 100 MG capsule Take 1 capsule (100 mg total) by mouth 3 (three) times daily as needed for cough (Take  1-2 per dose). 11/25/17   Patricia Schneider, Patricia Ivory, PA-C  fluticasone (FLONASE) 50 MCG/ACT nasal spray Place 2 sprays into both nostrils daily. 11/25/17   Patricia Schneider, Patricia Ivory, PA-C    Allergies    Codeine  Review of Systems   Review of Systems  Neurological: Positive for numbness.  All other systems reviewed and are negative.   Physical Exam Updated Vital Signs BP (!) 152/100 (BP Location: Left Arm)   Pulse 98   Temp 98.9 F (37.2 C) (Oral)   Resp 16   Ht 5\' 4"  (1.626 m)   Wt 96.2 kg   SpO2 96%   BMI 36.39 kg/m   Physical Exam Vitals and nursing note reviewed.  Constitutional:      Appearance: Normal appearance.  HENT:     Head: Normocephalic.     Nose: Nose normal.     Mouth/Throat:     Mouth: Mucous membranes are moist.  Eyes:     Extraocular Movements: Extraocular movements intact.     Pupils: Pupils are equal, round, and reactive to light.  Neck:     Comments: R paracervical tenderness, nl ROM  Cardiovascular:     Rate and Rhythm: Normal rate and regular rhythm.     Pulses: Normal pulses.  Pulmonary:     Effort:  Pulmonary effort is normal.     Breath sounds: Normal breath sounds.  Abdominal:     General: Abdomen is flat.     Palpations: Abdomen is soft.  Musculoskeletal:        General: Normal range of motion.     Cervical Schneider: Normal range of motion.     Comments: Nl capillary refill bilateral hands and feet, no saddle anesthesia   Skin:    General: Skin is warm.     Capillary Refill: Capillary refill takes less than 2 seconds.  Neurological:     General: No focal deficit present.     Mental Status: She is alert and oriented to person, place, and time.     Comments: CN 2- 12 intact. Nl strength throughout. Slightly dec sensation R 4th and 5th fingers, bilateral big toes. However, there is nl sensation proximal to the R wrist and no saddle anesthesia of the legs. Nl reflexes bilateral knees and elbow.   Psychiatric:        Mood and Affect: Mood  normal.        Behavior: Behavior normal.     ED Results / Procedures / Treatments   Labs (all labs ordered are listed, but only abnormal results are displayed) Labs Reviewed  CBC WITH DIFFERENTIAL/PLATELET  COMPREHENSIVE METABOLIC PANEL    EKG None  Radiology No results found.  Procedures Procedures (including critical care time)  Medications Ordered in ED Medications - No data to display  ED Course  I have reviewed the triage vital signs and the nursing notes.  Pertinent labs & imaging results that were available during my care of the patient were reviewed by me and considered in my medical decision making (see chart for details).    MDM Rules/Calculators/A&P                      Patricia Schneider is a 64 y.o. female here presenting with right arm numbness and bilateral toe numbness.  Symptoms for about 2 weeks or so.  Consider cervical radiculopathy.  Unusual to have a stroke that would cause bilateral great toe numbness.  Patient has no Schneider pain or Schneider tenderness to suggest lumbar radiculopathy.  Will get MRI brain and cervical spine to consider cervical radiculopathy versus stroke.  Will get basic lab work as well.  6:57 PM WBC is 19. K, 2.9 supplemented. Hypokalemia can cause her symptoms.  I ordered a chest x-ray and urinalysis and MRI brain and cervical spine.  Patient states that she does not want to wait so she left AGAINST MEDICAL ADVICE.  Final Clinical Impression(s) / ED Diagnoses Final diagnoses:  None    Rx / DC Orders ED Discharge Orders    None       Patricia Freeze, MD 01/16/20 1858

## 2020-01-16 NOTE — ED Notes (Signed)
Pt speaking with this RN in NAD. Pt eating graham crackers and requesting tv channel changed and remote

## 2020-01-16 NOTE — ED Notes (Addendum)
Pt given crackers and apple juice per request. Pt aware urine sample needed

## 2020-01-16 NOTE — ED Notes (Signed)
Spoke with Dr. Mayford Knife, per Dr. Mayford Knife no protocols at this time.

## 2020-05-12 ENCOUNTER — Other Ambulatory Visit: Payer: Self-pay

## 2020-05-12 ENCOUNTER — Emergency Department
Admission: EM | Admit: 2020-05-12 | Discharge: 2020-05-12 | Disposition: A | Discharge status: Home / Self Care | Payer: No Typology Code available for payment source | Attending: Emergency Medicine | Admitting: Emergency Medicine

## 2020-05-12 DIAGNOSIS — Z760 Encounter for issue of repeat prescription: Secondary | ICD-10-CM | POA: Insufficient documentation

## 2020-05-12 MED ORDER — LOSARTAN POTASSIUM 100 MG PO TABS: 100.0000 mg | ORAL_TABLET | Freq: Every day | ORAL | 0 refills | Status: AC

## 2020-05-12 MED ORDER — FUROSEMIDE 40 MG PO TABS
40.0000 mg | ORAL_TABLET | Freq: Once | ORAL | Status: AC
  Administered 2020-05-12: 40 mg via ORAL
  Filled 2020-05-12: qty 1

## 2020-05-12 MED ORDER — FUROSEMIDE 40 MG PO TABS: 40.0000 mg | ORAL_TABLET | Freq: Every day | ORAL | 0 refills | Status: AC

## 2020-05-12 MED ORDER — LOSARTAN POTASSIUM 50 MG PO TABS
100.0000 mg | ORAL_TABLET | Freq: Once | ORAL | Status: AC
  Administered 2020-05-12: 100 mg via ORAL
  Filled 2020-05-12: qty 2

## 2020-05-12 NOTE — Discharge Instructions (Addendum)
Please call the [facility] for an appointment and please get your medications transferred here.

## 2020-05-12 NOTE — ED Triage Notes (Signed)
Pt states she has been out of lasix x 5 days and her BP meds. States noticed feet swelling. A&), ambulatory. Normally sees [place] but states "i'm not trying to drive all the way out there."

## 2020-05-12 NOTE — ED Provider Notes (Signed)
[facility] Emergency Department Provider Note  ____________________________________________  Time seen: Approximately 3:39 PM  I have reviewed the triage vital signs and the nursing notes.   HISTORY  Chief Complaint Medication Refill    HPI Patricia Schneider is a 64 y.o. female that presents to the emergency department for medication refill.  Patient states that she has been out of her 40 mg of Lasix and 100 mg of losartan for 5 days.  She is starting to notice some minimal swelling to her ankles.  Patient was admitted to the hospital in April for congestive heart failure.  Patient states that her primary care doctor is at the [place] in [place].  All of her medications were mail-in and have been mailed to [place].  She does not want to drive to [facility] to get her medications.  She just moved to the area.  She is going to get into see the [place] in [place].  She presents to emergency department solely for medication refill and has no additional concerns. She does not need any medications other than the losartan and the lasix. No SOB or CP.   Past Medical History:  Diagnosis Date  . Depression   . Hypertension     There are no problems to display for this patient.   History reviewed. No pertinent surgical history.  Prior to Admission medications   Medication Sig Start Date End Date Taking? Authorizing Provider  amLODipine (NORVASC) 10 MG tablet Take 10 mg by mouth daily.    [provider]  azithromycin (ZITHROMAX Z-PAK) 250 MG tablet Take 2 tablets (500 mg) on  Day 1,  followed by 1 tablet (250 mg) once daily on Days 2 through 5. 11/25/17   Menshew, Charlesetta Ivory, PA-C  benzonatate (TESSALON PERLES) 100 MG capsule Take 1 capsule (100 mg total) by mouth 3 (three) times daily as needed for cough (Take 1-2 per dose). 11/25/17   Menshew, Charlesetta Ivory, PA-C  fluticasone (FLONASE) 50 MCG/ACT nasal spray Place 2 sprays into both nostrils daily. 11/25/17    Menshew, Charlesetta Ivory, PA-C  furosemide (LASIX) 40 MG tablet Take 1 tablet (40 mg total) by mouth daily. 05/12/20 05/12/21  Enid Derry, PA-C  losartan (COZAAR) 100 MG tablet Take 1 tablet (100 mg total) by mouth daily. 05/12/20 05/12/21  Enid Derry, PA-C    Allergies Codeine  History reviewed. No pertinent family history.  Social History Social History   Tobacco Use  . Smoking status: Current Every Day Smoker  . Smokeless tobacco: Never Used  Substance Use Topics  . Alcohol use: No  . Drug use: No     Review of Systems  Constitutional: No fever/chills ENT: No upper respiratory complaints. Cardiovascular: No chest pain. Respiratory: No cough. No SOB. Gastrointestinal: No abdominal pain.  No vomiting.  Musculoskeletal: Negative for musculoskeletal pain. Skin: Negative for rash, abrasions, lacerations, ecchymosis. Neurological: Negative for headaches   ____________________________________________   PHYSICAL EXAM:  VITAL SIGNS: ED Triage Vitals  Enc Vitals Group     BP 05/12/20 1439 (!) 171/102     Pulse Rate 05/12/20 1438 (!) 104     Resp 05/12/20 1436 18     Temp 05/12/20 1438 99.2 F (37.3 C)     Temp Source 05/12/20 1438 Oral     SpO2 05/12/20 1438 95 %     Weight 05/12/20 1438 222 lb (100.7 kg)     Height 05/12/20 1438 5\' 4"  (1.626 m)     Head  Circumference --      Peak Flow --      Pain Score 05/12/20 1438 0     Pain Loc --      Pain Edu? --      Excl. in GC? --      Constitutional: Alert and oriented. Well appearing and in no acute distress. Eyes: Conjunctivae are normal. PERRL. EOMI. Head: Atraumatic. ENT:      Ears:      Nose: No congestion/rhinnorhea.      Mouth/Throat: Mucous membranes are moist.  Neck: No stridor. Cardiovascular: Normal rate, regular rhythm.  Good peripheral circulation. Respiratory: Normal respiratory effort without tachypnea or retractions. Lungs CTAB. Good air entry to the bases with no decreased or absent breath  sounds. Gastrointestinal: Bowel sounds 4 quadrants. Soft and nontender to palpation. No guarding or rigidity. No palpable masses. No distention. Musculoskeletal: Full range of motion to all extremities. No gross deformities appreciated. Neurologic:  Normal speech and language. No gross focal neurologic deficits are appreciated.  Skin:  Skin is warm, dry and intact. No rash noted. Psychiatric: Mood and affect are normal. Speech and behavior are normal. Patient exhibits appropriate insight and judgement.   ____________________________________________   LABS (all labs ordered are listed, but only abnormal results are displayed)  Labs Reviewed - No data to display ____________________________________________  EKG   ____________________________________________  RADIOLOGY   No results found.  ____________________________________________    PROCEDURES  Procedure(s) performed:    Procedures    Medications  losartan (COZAAR) tablet 100 mg (has no administration in time range)  furosemide (LASIX) tablet 40 mg (has no administration in time range)     ____________________________________________   INITIAL IMPRESSION / ASSESSMENT AND PLAN / ED COURSE  Pertinent labs & imaging results that were available during my care of the patient were reviewed by me and considered in my medical decision making (see chart for details).  Review of the [place] CSRS was performed in accordance of the [facility] prior to dispensing any controlled drugs.   Patient presented to emergency department for evaluation of medication refill.  Vital signs and exam are reassuring.  Patient was given a 2-week supply of her losartan and Lasix.  Patient states that she does have refills in [place] through the [place] and has not have been transferred here yet.  [facility] to [facility] at the [facility]. Patient denies any SOB or CP.  Patient will be  discharged home with prescriptions for losartan and Lasix. Patient is to follow up with primary care as directed. Patient is given ED precautions to return to the ED for any worsening or new symptoms.  Jassmine Hardaway was evaluated in Emergency Department on 05/12/2020 for the symptoms described in the history of present illness. She was evaluated in the context of the global COVID-19 pandemic, which necessitated consideration that the patient might be at risk for infection with the SARS-CoV-2 virus that causes COVID-19. Institutional protocols and algorithms that pertain to the evaluation of patients at risk for COVID-19 are in a state of rapid change based on information released by regulatory bodies including the CDC and federal and state organizations. These policies and algorithms were followed during the patient's care in the ED.   ____________________________________________  FINAL CLINICAL IMPRESSION(S) / ED DIAGNOSES  Final diagnoses:  Medication refill      NEW MEDICATIONS STARTED DURING THIS VISIT:  ED Discharge Orders  Ordered    losartan (COZAAR) 100 MG tablet  Daily     Discontinue  Reprint     05/12/20 1558    furosemide (LASIX) 40 MG tablet  Daily     Discontinue  Reprint     05/12/20 1558              This chart was dictated using voice recognition software/Dragon. Despite best efforts to proofread, errors can occur which can change the meaning. Any change was purely unintentional.    Enid Derry, PA-C 05/12/20 2354    Emily Filbert, MD 05/15/20 [phone]

## 2021-01-09 ENCOUNTER — Emergency Department
Admission: EM | Admit: 2021-01-09 | Discharge: 2021-01-09 | Disposition: A | Discharge status: Home / Self Care | Payer: No Typology Code available for payment source | Attending: Emergency Medicine | Admitting: Emergency Medicine

## 2021-01-09 ENCOUNTER — Encounter: Payer: Self-pay | Admitting: Emergency Medicine

## 2021-01-09 ENCOUNTER — Other Ambulatory Visit: Payer: Self-pay

## 2021-01-09 DIAGNOSIS — L7622 Postprocedural hemorrhage and hematoma of skin and subcutaneous tissue following other procedure: Secondary | ICD-10-CM | POA: Insufficient documentation

## 2021-01-09 DIAGNOSIS — Z5321 Procedure and treatment not carried out due to patient leaving prior to being seen by health care provider: Secondary | ICD-10-CM | POA: Insufficient documentation

## 2021-01-09 NOTE — ED Notes (Signed)
Called x1 in lobby at this time and also called pt cell phone with no answer.

## 2021-01-09 NOTE — ED Triage Notes (Signed)
Patient presents to the ED with bleeding to her right hand from incision.  Patient had hand surgery yesterday.  Patient states she hit her hand on something prior to bleeding.  Bleeding appears controlled at this time.

## 2021-02-11 ENCOUNTER — Emergency Department
Admission: EM | Admit: 2021-02-11 | Discharge: 2021-02-11 | Disposition: A | Discharge status: Home / Self Care | Payer: No Typology Code available for payment source | Attending: Emergency Medicine | Admitting: Emergency Medicine

## 2021-02-11 ENCOUNTER — Other Ambulatory Visit: Payer: Self-pay

## 2021-02-11 ENCOUNTER — Emergency Department: Payer: No Typology Code available for payment source

## 2021-02-11 DIAGNOSIS — R059 Cough, unspecified: Secondary | ICD-10-CM

## 2021-02-11 DIAGNOSIS — F17210 Nicotine dependence, cigarettes, uncomplicated: Secondary | ICD-10-CM | POA: Insufficient documentation

## 2021-02-11 DIAGNOSIS — I10 Essential (primary) hypertension: Secondary | ICD-10-CM | POA: Diagnosis not present

## 2021-02-11 DIAGNOSIS — Z79899 Other long term (current) drug therapy: Secondary | ICD-10-CM | POA: Insufficient documentation

## 2021-02-11 DIAGNOSIS — J189 Pneumonia, unspecified organism: Secondary | ICD-10-CM | POA: Insufficient documentation

## 2021-02-11 LAB — BASIC METABOLIC PANEL
Anion gap: 10 (ref 5–15)
BUN: 10 mg/dL (ref 8–23)
CO2: 30 mmol/L (ref 22–32)
Calcium: 9.2 mg/dL (ref 8.9–10.3)
Chloride: 98 mmol/L (ref 98–111)
Creatinine, Ser: 0.77 mg/dL (ref 0.44–1.00)
GFR, Estimated: >60 mL/min (ref >60)
Glucose, Bld: 186 mg/dL — ABNORMAL HIGH (ref 70–99)
Potassium: 3.5 mmol/L (ref 3.5–5.1)
Sodium: 138 mmol/L (ref 135–145)

## 2021-02-11 LAB — CBC
HCT: 46.5 % — ABNORMAL HIGH (ref 36.0–46.0)
Hemoglobin: 14.3 g/dL (ref 12.0–15.0)
MCH: 27.3 pg (ref 26.0–34.0)
MCHC: 30.8 g/dL (ref 30.0–36.0)
MCV: 88.9 fL (ref 80.0–100.0)
Platelets: 302 10*3/uL (ref 150–400)
RBC: 5.23 MIL/uL — ABNORMAL HIGH (ref 3.87–5.11)
RDW: 14.9 % (ref 11.5–15.5)
WBC: 12.1 10*3/uL — ABNORMAL HIGH (ref 4.0–10.5)
nRBC: 0 % (ref 0.0–0.2)

## 2021-02-11 LAB — TROPONIN I (HIGH SENSITIVITY)
Troponin I (High Sensitivity): 11 ng/L (ref <18)
Troponin I (High Sensitivity): 11 ng/L (ref <18)

## 2021-02-11 MED ORDER — DOXYCYCLINE HYCLATE 100 MG PO CAPS: 100.0000 mg | ORAL_CAPSULE | Freq: Two times a day (BID) | ORAL | 0 refills | Status: AC

## 2021-02-11 MED ORDER — PREDNISONE 20 MG PO TABS
60.0000 mg | ORAL_TABLET | Freq: Once | ORAL | Status: AC
  Administered 2021-02-11: 60 mg via ORAL
  Filled 2021-02-11: qty 3

## 2021-02-11 MED ORDER — LEVOTHYROXINE SODIUM 25 MCG PO TABS: 25.0000 ug | ORAL_TABLET | Freq: Every day | ORAL | 0 refills | Status: AC

## 2021-02-11 MED ORDER — PREDNISONE 10 MG (21) PO TBPK: ORAL_TABLET | ORAL | 0 refills | Status: DC

## 2021-02-11 MED ORDER — IPRATROPIUM-ALBUTEROL 0.5-2.5 (3) MG/3ML IN SOLN
3.0000 mL | Freq: Once | RESPIRATORY_TRACT | Status: DC
  Filled 2021-02-11: qty 3

## 2021-02-11 NOTE — Discharge Instructions (Addendum)
Please seek medical attention for any high fevers, chest pain, shortness of breath, change in behavior, persistent vomiting, bloody stool or any other new or concerning symptoms.  

## 2021-02-11 NOTE — ED Notes (Signed)
Pt has been provided with discharge instructions. Pt denies any questions or concerns at this time. Pt verbalizes understanding for follow up care and d/c.  VSS.  Pt left department with all belongings.  

## 2021-02-11 NOTE — ED Provider Notes (Signed)
[facility] Emergency Department Provider Note  ____________________________________________   I have reviewed the triage vital signs and the nursing notes.   HISTORY  Chief Complaint Cough  History limited by: Not Limited   HPI Patricia Schneider is a 65 y.o. female who presents to the emergency department today patient presented to the emergency department today because of concerns for cough.  She states that the symptoms have been present for the past 3 days.  The cough is productive of thick phlegm.  In addition she has noticed some shortness of breath and wheezing.  Additionally she has had some chest discomfort.  She states she has had similar symptoms in the past and when she typically gets the symptoms she is treated with steroids and antibiotics.  She does have inhaler and breathing machine at home which she is utilizing.  She denies any fevers.  She states that a family member has recently been sick with similar symptoms.   Records reviewed. Per medical record review patient has a history of hypertension.  Past Medical History:  Diagnosis Date  . Depression   . Hypertension     There are no problems to display for this patient.   Past Surgical History:  Procedure Laterality Date  . HAND SURGERY      Prior to Admission medications   Medication Sig Start Date End Date Taking? Authorizing Provider  amLODipine (NORVASC) 10 MG tablet Take 10 mg by mouth daily.    [provider]  azithromycin (ZITHROMAX Z-PAK) 250 MG tablet Take 2 tablets (500 mg) on  Day 1,  followed by 1 tablet (250 mg) once daily on Days 2 through 5. 11/25/17   Menshew, Charlesetta Ivory, PA-C  benzonatate (TESSALON PERLES) 100 MG capsule Take 1 capsule (100 mg total) by mouth 3 (three) times daily as needed for cough (Take 1-2 per dose). 11/25/17   Menshew, Charlesetta Ivory, PA-C  fluticasone (FLONASE) 50 MCG/ACT nasal spray Place 2 sprays into both nostrils daily. 11/25/17    Menshew, Charlesetta Ivory, PA-C  furosemide (LASIX) 40 MG tablet Take 1 tablet (40 mg total) by mouth daily. 05/12/20 05/12/21  Enid Derry, PA-C  losartan (COZAAR) 100 MG tablet Take 1 tablet (100 mg total) by mouth daily. 05/12/20 05/12/21  Enid Derry, PA-C    Allergies Codeine, Lisinopril, and Prozac [fluoxetine]  No family history on file.  Social History Social History   Tobacco Use  . Smoking status: Current Every Day Smoker    Packs/day: 0.25    Types: Cigarettes  . Smokeless tobacco: Never Used  Substance Use Topics  . Alcohol use: No  . Drug use: No    Review of Systems Constitutional: No fever/chills Eyes: No visual changes. ENT: No sore throat. Cardiovascular: Positive for chest pain. Respiratory: Positive for cough and shortness of breath. Gastrointestinal: No abdominal pain.  No nausea, no vomiting.  No diarrhea.   Genitourinary: Negative for dysuria. Musculoskeletal: Negative for back pain. Skin: Negative for rash. Neurological: Negative for headaches, focal weakness or numbness.  ____________________________________________   PHYSICAL EXAM:  VITAL SIGNS: ED Triage Vitals  Enc Vitals Group     BP 02/11/21 1110 (!) 176/104     Pulse Rate 02/11/21 1110 99     Resp 02/11/21 1110 20     Temp 02/11/21 1110 98.4 F (36.9 C)     Temp Source 02/11/21 1110 Oral     SpO2 02/11/21 1110 98 %     Weight 02/11/21 1113  210 lb (95.3 kg)     Height 02/11/21 1113 5\' 4"  (1.626 m)     Head Circumference --      Peak Flow --      Pain Score 02/11/21 1113 8   Constitutional: Alert and oriented.  Eyes: Conjunctivae are normal.  ENT      Head: Normocephalic and atraumatic.      Nose: No congestion/rhinnorhea.      Mouth/Throat: Mucous membranes are moist.      Neck: No stridor. Hematological/Lymphatic/Immunilogical: No cervical lymphadenopathy. Cardiovascular: Normal rate, regular rhythm.  No murmurs, rubs, or gallops.  Respiratory: Normal respiratory effort  without tachypnea nor retractions. Diffuse expiratory wheezing.  Gastrointestinal: Soft and non tender. No rebound. No guarding.  Genitourinary: Deferred Musculoskeletal: Normal range of motion in all extremities. No lower extremity edema. Neurologic:  Normal speech and language. No gross focal neurologic deficits are appreciated.  Skin:  Skin is warm, dry and intact. No rash noted. Psychiatric: Mood and affect are normal. Speech and behavior are normal. Patient exhibits appropriate insight and judgment.  ____________________________________________    LABS (pertinent positives/negatives)  BMP wnl except glu 186 CBC wbc 12.1, hgb 14.3, plt 302 Trop hs 11 x 2  ____________________________________________   EKG  I, 02/13/21, attending physician, personally viewed and interpreted this EKG  EKG Time: 1115 Rate: 92 Rhythm: normal sinus rhythm Axis: left axis deviation Intervals: qtc 445 QRS: narrow, LVH ST changes: no st elevation Impression: abnormal ekg   ____________________________________________    RADIOLOGY  CXR Opacity in medial right lung base  ____________________________________________   PROCEDURES  Procedures  ____________________________________________   INITIAL IMPRESSION / ASSESSMENT AND PLAN / ED COURSE  Pertinent labs & imaging results that were available during my care of the patient were reviewed by me and considered in my medical decision making (see chart for details).   Patient presented to the emergency department today with concerns for some cough, shortness of breath and chest pain.  She states this feels like previous episodes that she has had.  She states normally she was treated with steroids and antibiotics.  Did offer IV steroids here.  Patient declined.  Will plan on discharging home with steroids, antibiotics.  Patient does have breathing treatments at home already.  Additionally patient requested refill of her thyroid  medication.  ____________________________________________   FINAL CLINICAL IMPRESSION(S) / ED DIAGNOSES  Final diagnoses:  Cough  Pneumonia due to infectious organism, unspecified laterality, unspecified part of lung     Note: This dictation was prepared with Dragon dictation. Any transcriptional errors that result from this process are unintentional     Phineas Semen, MD 02/11/21 1651

## 2021-02-11 NOTE — ED Notes (Signed)
Pt consents to receiving discharge instructions

## 2021-02-11 NOTE — ED Triage Notes (Signed)
Pt to ER via POV with complaints of L sided non-radiating chest pain that is intermittent and started last night. Pt also with complaints of a productive cough, clear sputum productive, started approx 5 days ago.

## 2021-03-02 ENCOUNTER — Emergency Department
Admission: EM | Admit: 2021-03-02 | Discharge: 2021-03-02 | Disposition: A | Discharge status: Home / Self Care | Payer: No Typology Code available for payment source | Attending: Physician Assistant | Admitting: Physician Assistant

## 2021-03-02 ENCOUNTER — Emergency Department: Payer: No Typology Code available for payment source

## 2021-03-02 ENCOUNTER — Other Ambulatory Visit: Payer: Self-pay

## 2021-03-02 ENCOUNTER — Encounter: Payer: Self-pay | Admitting: Emergency Medicine

## 2021-03-02 DIAGNOSIS — I10 Essential (primary) hypertension: Secondary | ICD-10-CM | POA: Insufficient documentation

## 2021-03-02 DIAGNOSIS — Z760 Encounter for issue of repeat prescription: Secondary | ICD-10-CM | POA: Diagnosis not present

## 2021-03-02 DIAGNOSIS — Z79899 Other long term (current) drug therapy: Secondary | ICD-10-CM | POA: Insufficient documentation

## 2021-03-02 DIAGNOSIS — J189 Pneumonia, unspecified organism: Secondary | ICD-10-CM | POA: Diagnosis not present

## 2021-03-02 DIAGNOSIS — F17210 Nicotine dependence, cigarettes, uncomplicated: Secondary | ICD-10-CM | POA: Insufficient documentation

## 2021-03-02 DIAGNOSIS — E039 Hypothyroidism, unspecified: Secondary | ICD-10-CM | POA: Insufficient documentation

## 2021-03-02 DIAGNOSIS — R059 Cough, unspecified: Secondary | ICD-10-CM | POA: Diagnosis present

## 2021-03-02 HISTORY — DX: Disorder of thyroid, unspecified: E07.9

## 2021-03-02 MED ORDER — BENZONATATE 100 MG PO CAPS: 200.0000 mg | ORAL_CAPSULE | Freq: Three times a day (TID) | ORAL | 0 refills | Status: AC | PRN

## 2021-03-02 MED ORDER — IPRATROPIUM-ALBUTEROL 0.5-2.5 (3) MG/3ML IN SOLN
3.0000 mL | Freq: Once | RESPIRATORY_TRACT | Status: AC
  Administered 2021-03-02: 3 mL via RESPIRATORY_TRACT
  Filled 2021-03-02: qty 3

## 2021-03-02 MED ORDER — CLONIDINE HCL 0.1 MG PO TABS
0.2000 mg | ORAL_TABLET | Freq: Once | ORAL | Status: AC
  Administered 2021-03-02: 0.2 mg via ORAL
  Filled 2021-03-02: qty 2

## 2021-03-02 MED ORDER — METHYLPREDNISOLONE 4 MG PO TBPK: ORAL_TABLET | ORAL | 0 refills | Status: DC

## 2021-03-02 MED ORDER — DOXYCYCLINE MONOHYDRATE 100 MG PO CAPS: 100.0000 mg | ORAL_CAPSULE | Freq: Two times a day (BID) | ORAL | 0 refills | Status: DC

## 2021-03-02 NOTE — ED Triage Notes (Signed)
C/O productive cough x 3 days.  States coughing up green sputum.  Also states out of thyroid medication x 2 weeks.

## 2021-03-02 NOTE — Discharge Instructions (Signed)
Follow discharge care instruction take medication as directed.   [facility]

## 2021-03-02 NOTE — ED Notes (Signed)
Hx HTN, has not taken bp meds in 3 days

## 2021-03-02 NOTE — ED Provider Notes (Signed)
[facility] Emergency Department Provider Note   ____________________________________________   None    (approximate)  I have reviewed the triage vital signs and the nursing notes.   HISTORY  Chief Complaint Cough    HPI Patricia Schneider is a 65 y.o. female patient presents with productive cough for 3 days.  Patient states sputum is green in color.  Patient also states she is out of all her medications for 2 weeks.  It is noted the patient is hypertensive 183/111.  Patient initial encounter was belligerent demanding medications without thorough evaluation.  Denies fever/chills.  Denies recent travel or known contact with COVID-19.  Does not wish to be tested for COVID-19.  Patient states she has been without her maintenance medication for hypertension and hypothyroidism for 2 weeks.  Patient states she gets maintenance medication from the [place] and has not made an appointment for refills.         Past Medical History:  Diagnosis Date  . Depression   . Hypertension   . Thyroid disease     There are no problems to display for this patient.   Past Surgical History:  Procedure Laterality Date  . HAND SURGERY      Prior to Admission medications   Medication Sig Start Date End Date Taking? Authorizing Provider  benzonatate (TESSALON PERLES) 100 MG capsule Take 2 capsules (200 mg total) by mouth 3 (three) times daily as needed. 03/02/21 03/02/22 Yes Joni Reining, PA-C  doxycycline (MONODOX) 100 MG capsule Take 1 capsule (100 mg total) by mouth 2 (two) times daily. 03/02/21  Yes Joni Reining, PA-C  methylPREDNISolone (MEDROL DOSEPAK) 4 MG TBPK tablet Take Tapered dose as directed 03/02/21  Yes Joni Reining, PA-C  amLODipine (NORVASC) 10 MG tablet Take 10 mg by mouth daily.    [provider]  fluticasone (FLONASE) 50 MCG/ACT nasal spray Place 2 sprays into both nostrils daily. 11/25/17   Menshew, Charlesetta Ivory, PA-C  furosemide (LASIX) 40 MG  tablet Take 1 tablet (40 mg total) by mouth daily. 05/12/20 05/12/21  Enid Derry, PA-C  levothyroxine (SYNTHROID) 25 MCG tablet Take 1 tablet (25 mcg total) by mouth daily before breakfast. 02/11/21   Phineas Semen, MD  losartan (COZAAR) 100 MG tablet Take 1 tablet (100 mg total) by mouth daily. 05/12/20 05/12/21  Enid Derry, PA-C    Allergies Codeine, Lisinopril, and Prozac [fluoxetine]  No family history on file.  Social History Social History   Tobacco Use  . Smoking status: Current Every Day Smoker    Packs/day: 0.25    Types: Cigarettes  . Smokeless tobacco: Never Used  Substance Use Topics  . Alcohol use: No  . Drug use: No    Review of Systems Constitutional: No fever/chills Eyes: No visual changes. ENT: No sore throat. Cardiovascular: Denies chest pain. Respiratory: Denies shortness of breath. Gastrointestinal: No abdominal pain.  No nausea, no vomiting.  No diarrhea.  No constipation. Genitourinary: Negative for dysuria. Musculoskeletal: Negative for back pain. Skin: Negative for rash. Neurological: Negative for headaches, focal weakness or numbness. Psychiatric:  Depression Endocrine:  Hypertension and hypothyroidism. Allergic/Immunilogical: Codeine, lisinopril, and Prozac. ____________________________________________   PHYSICAL EXAM:  VITAL SIGNS: ED Triage Vitals  Enc Vitals Group     BP 03/02/21 1315 (!) 183/111     Pulse Rate 03/02/21 1315 (!) 114     Resp 03/02/21 1315 (!) 22     Temp 03/02/21 1315 99.2 F (37.3 C)  Temp Source 03/02/21 1315 Oral     SpO2 03/02/21 1315 97 %     Weight 03/02/21 1309 210 lb 1.6 oz (95.3 kg)     Height 03/02/21 1309 5\' 4"  (1.626 m)     Head Circumference --      Peak Flow --      Pain Score 03/02/21 1309 0     Pain Loc --      Pain Edu? --      Excl. in GC? --     Constitutional: Alert and oriented. Well appearing and in no acute distress. Eyes: Conjunctivae are normal. PERRL. EOMI. Head:  Atraumatic. Nose: No congestion/rhinnorhea. Mouth/Throat: Mucous membranes are moist.  Oropharynx non-erythematous. Neck: No stridor.   Hematological/Lymphatic/Immunilogical: No cervical lymphadenopathy. Cardiovascular: Tachycardic, regular rhythm. Grossly normal heart sounds.  Good peripheral circulation. Respiratory:Tachypnea.  No retractions. Lungs mild bilateral wheezing. Gastrointestinal: Soft and nontender. No distention. No abdominal bruits. No CVA tenderness. Genitourinary: Deferred Musculoskeletal: No lower extremity tenderness nor edema.  No joint effusions. Neurologic:  Normal speech and language. No gross focal neurologic deficits are appreciated. No gait instability. Skin:  Skin is warm, dry and intact. No rash noted. Psychiatric: Mood and affect are normal. Speech and behavior are normal.  ____________________________________________   LABS (all labs ordered are listed, but only abnormal results are displayed)  Labs Reviewed - No data to display ____________________________________________  EKG   ____________________________________________  RADIOLOGY I, 05/02/21, personally viewed and evaluated these images (plain radiographs) as part of my medical decision making, as well as reviewing the written report by the radiologist.  ED MD interpretation: Chest x-ray findings suggestive of early pneumonia.  Official radiology report(s): DG Chest Portable 1 View  Result Date: 03/02/2021 CLINICAL DATA:  Cough EXAM: PORTABLE CHEST 1 VIEW COMPARISON:  February 11, 2021 FINDINGS: Areas of atelectatic change noted in each lower lobe. No frank edema or airspace opacity. Heart upper normal in size with pulmonary vascularity normal. No adenopathy. No bone lesions IMPRESSION: Atelectatic change in the lower lung regions. Early developing pneumonia in these areas cannot be entirely excluded. Lungs otherwise clear. Heart upper normal in size. Electronically Signed   By: February 13, 2021 III M.D.   On: 03/02/2021 14:22    ____________________________________________   PROCEDURES  Procedure(s) performed (including Critical Care):  Procedures   ____________________________________________   INITIAL IMPRESSION / ASSESSMENT AND PLAN / ED COURSE  As part of my medical decision making, I reviewed the following data within the electronic MEDICAL RECORD NUMBER         Patient presents for productive cough for 3 days.  Patient also has elevated blood pressure secondary to noncompliance of medications.  Discussed x-ray findings with patient suggestive of early pneumonia.  Patient states she only wants medication for her chief complaint today.  She does not want maintenance medications filled.  Patient discharged blood pressure decreased to 166/105 status post 0.2 mg of Catapres.  Patient given discharge care instructions and advised take medications as directed.  Return to ED if condition worsens.  ____________________________________________   FINAL CLINICAL IMPRESSION(S) / ED DIAGNOSES  Final diagnoses:  Community acquired pneumonia, unspecified laterality  Hypertension, unspecified type  Hypothyroidism, unspecified type  Medication refill     ED Discharge Orders         Ordered    doxycycline (MONODOX) 100 MG capsule  2 times daily        03/02/21 1434    benzonatate (TESSALON PERLES) 100 MG capsule  3 times daily PRN        03/02/21 1434    methylPREDNISolone (MEDROL DOSEPAK) 4 MG TBPK tablet        03/02/21 1434          *Please note:  Patricia Schneider was evaluated in Emergency Department on 03/02/2021 for the symptoms described in the history of present illness. She was evaluated in the context of the global COVID-19 pandemic, which necessitated consideration that the patient might be at risk for infection with the SARS-CoV-2 virus that causes COVID-19. Institutional protocols and algorithms that pertain to the evaluation of patients at risk for  COVID-19 are in a state of rapid change based on information released by regulatory bodies including the CDC and federal and state organizations. These policies and algorithms were followed during the patient's care in the ED.  Some ED evaluations and interventions may be delayed as a result of limited staffing during and the pandemic.*   Note:  This document was prepared using Dragon voice recognition software and may include unintentional dictation errors.    Joni Reining, PA-C 03/02/21 1446    Concha Se, MD 03/02/21 [phone]

## 2021-06-23 IMAGING — CR DG CHEST 2V
2 series · 2 of 2 positions shown · non-contrast
Comparison: July 28, 2017

CLINICAL DATA: Chest pain

EXAM:
CHEST - 2 VIEW

[chest pa]
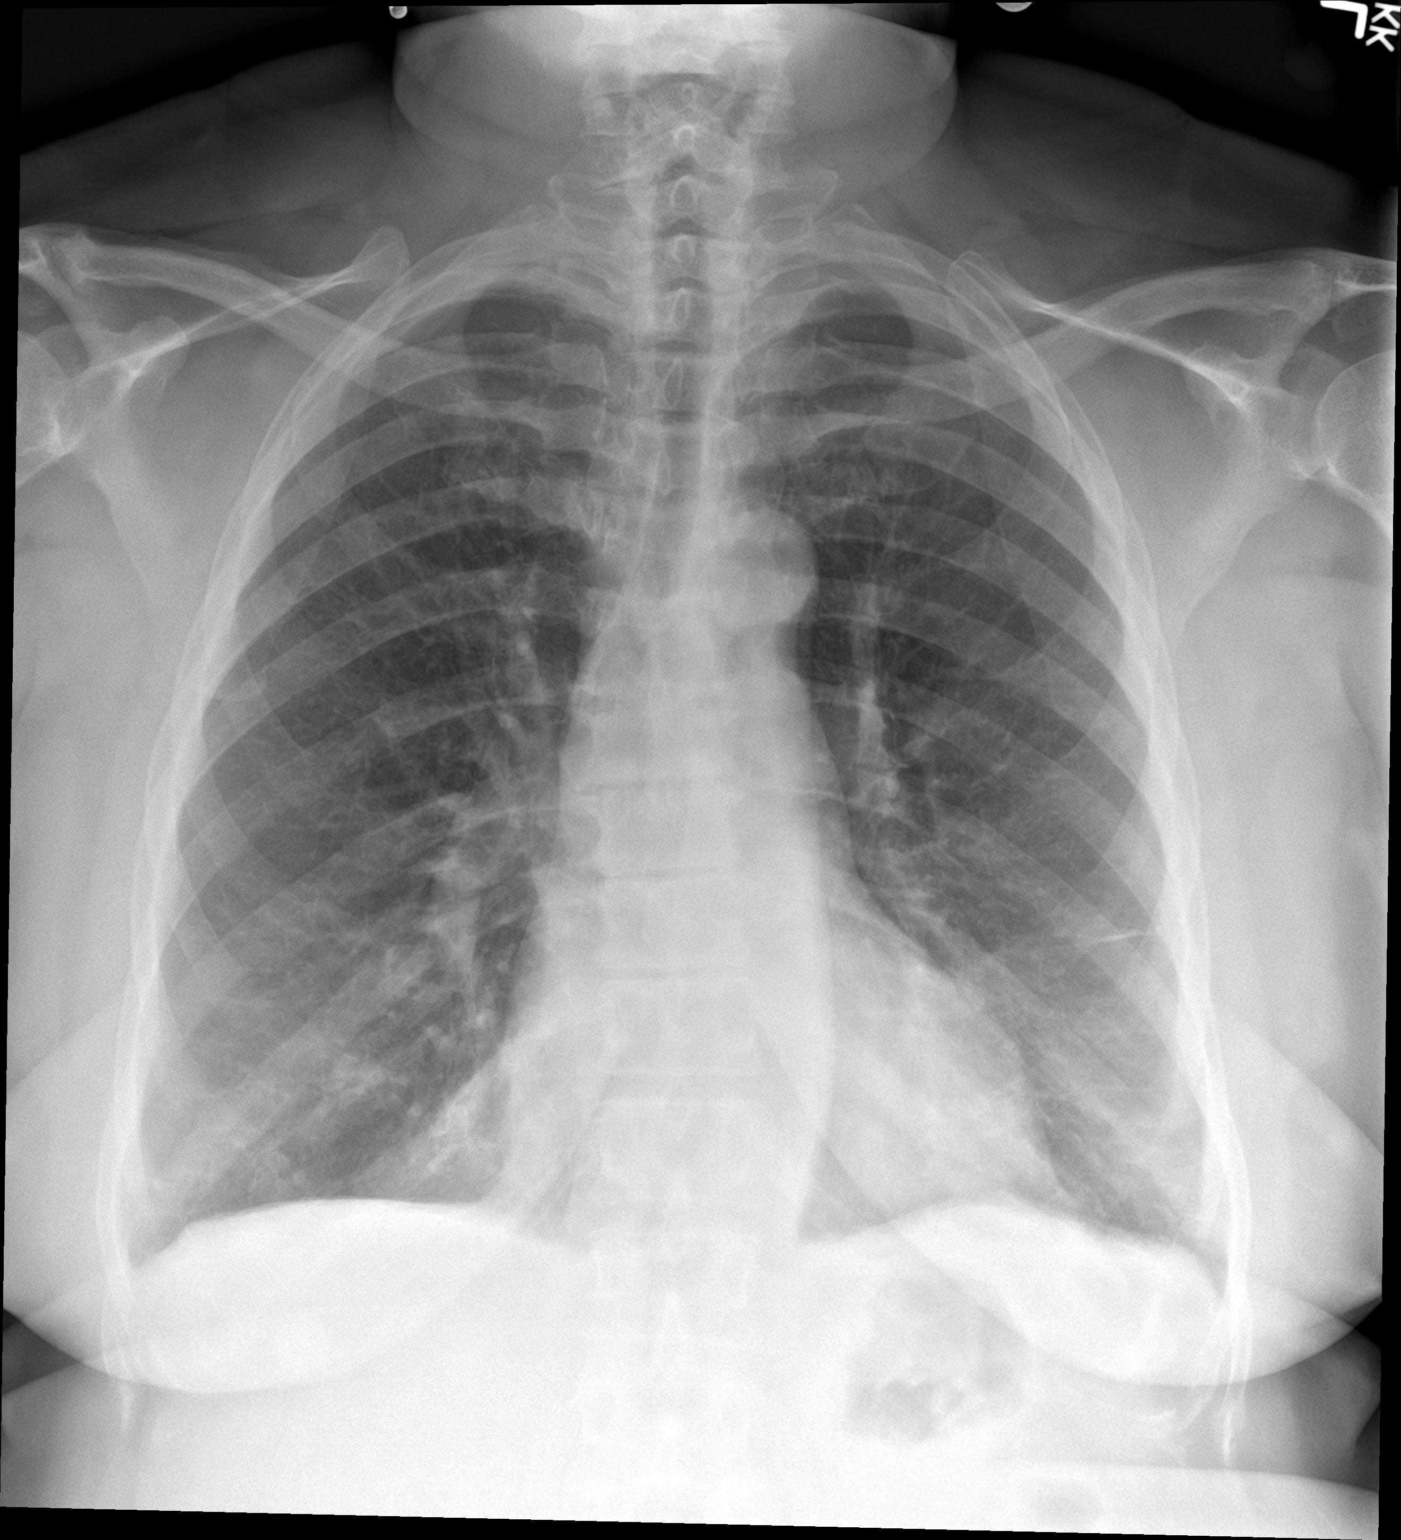

[chest lat]
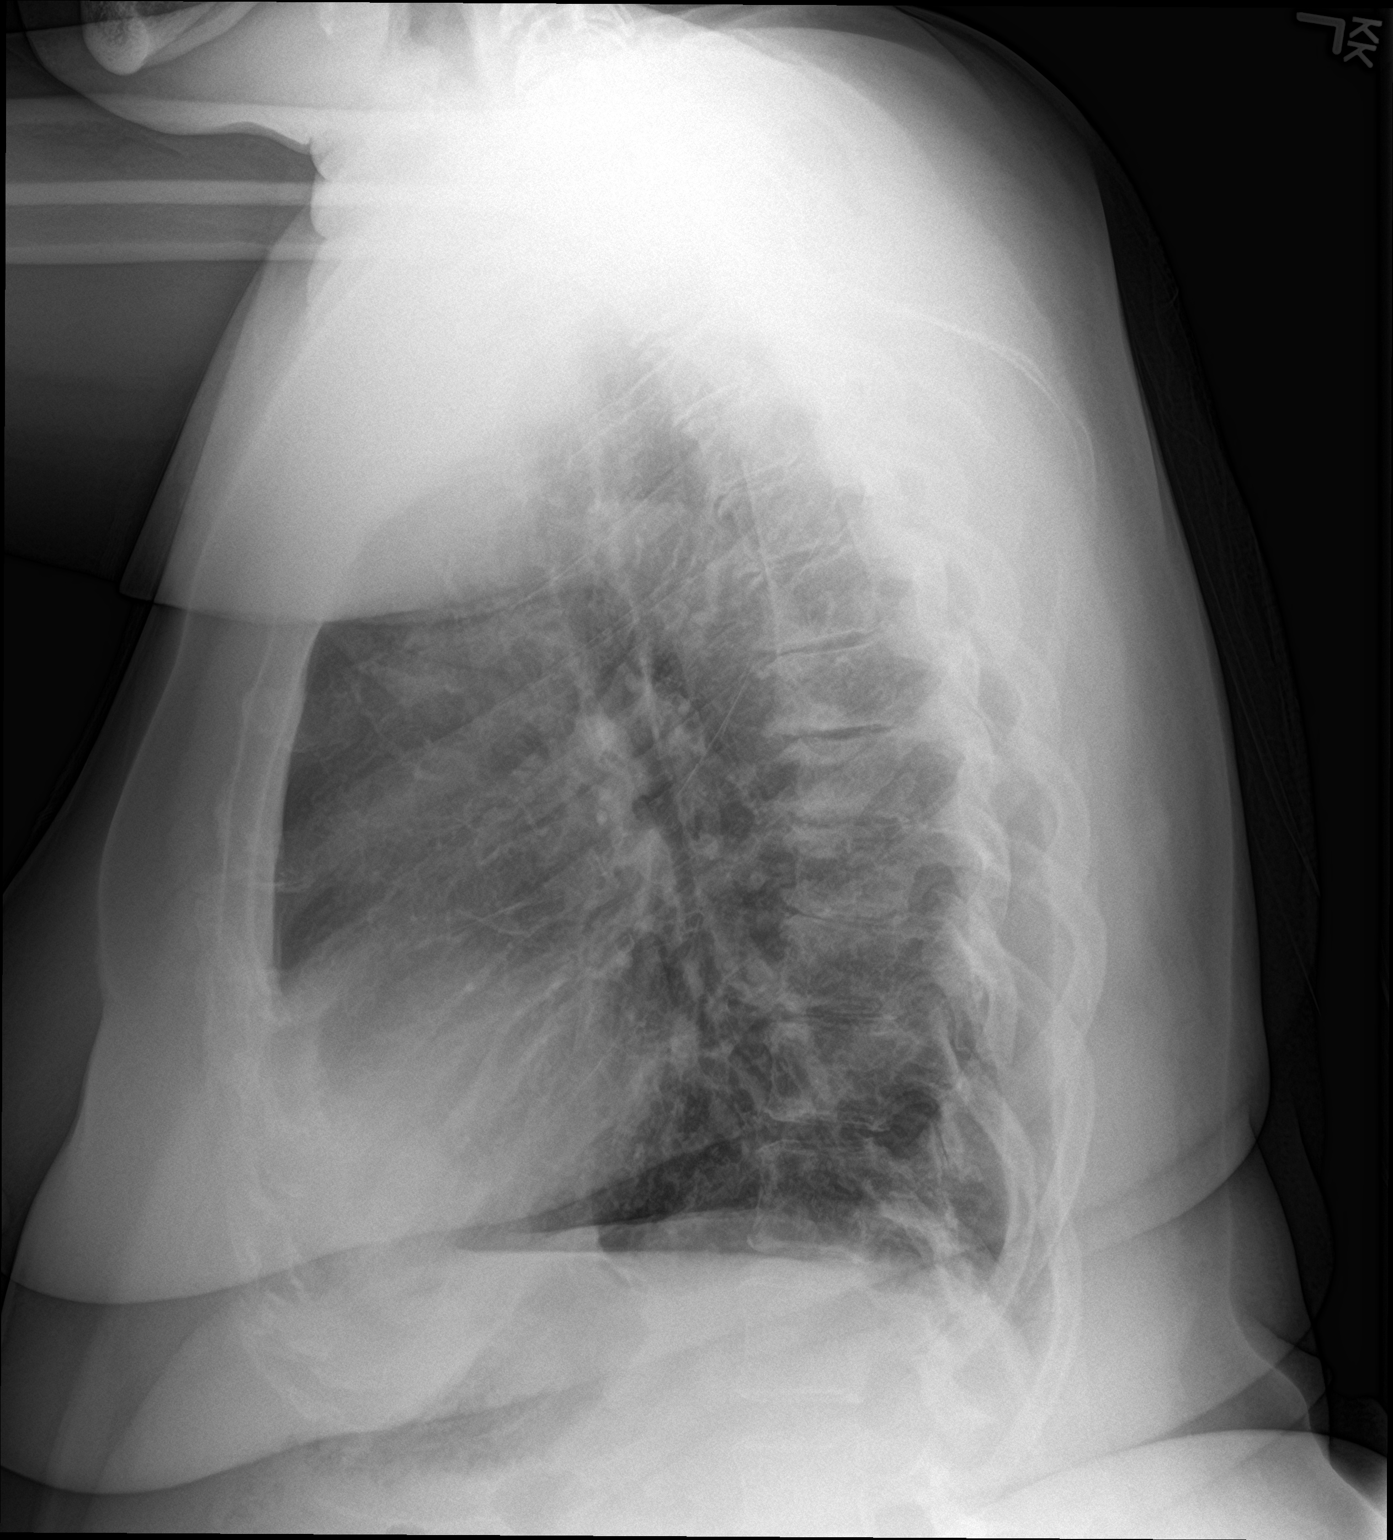

[2 of 2 positions shown; findings below may reference images not displayed]

FINDINGS: The heart size and mediastinal contours are within normal limits.
Minimal linear scar of left lung base is noted. The lungs are
hyperinflated. Mild opacity identified in the medial right lung
base. The visualized skeletal structures are unremarkable.
IMPRESSION: Mild opacity identified in the medial right lung base, developing
early pneumonia is not excluded.

## 2021-07-12 IMAGING — DX DG CHEST 1V PORT
1 series · 1 of 1 positions shown · non-contrast
Comparison: February 11, 2021

CLINICAL DATA: Cough

EXAM:
PORTABLE CHEST 1 VIEW

[chest ap]
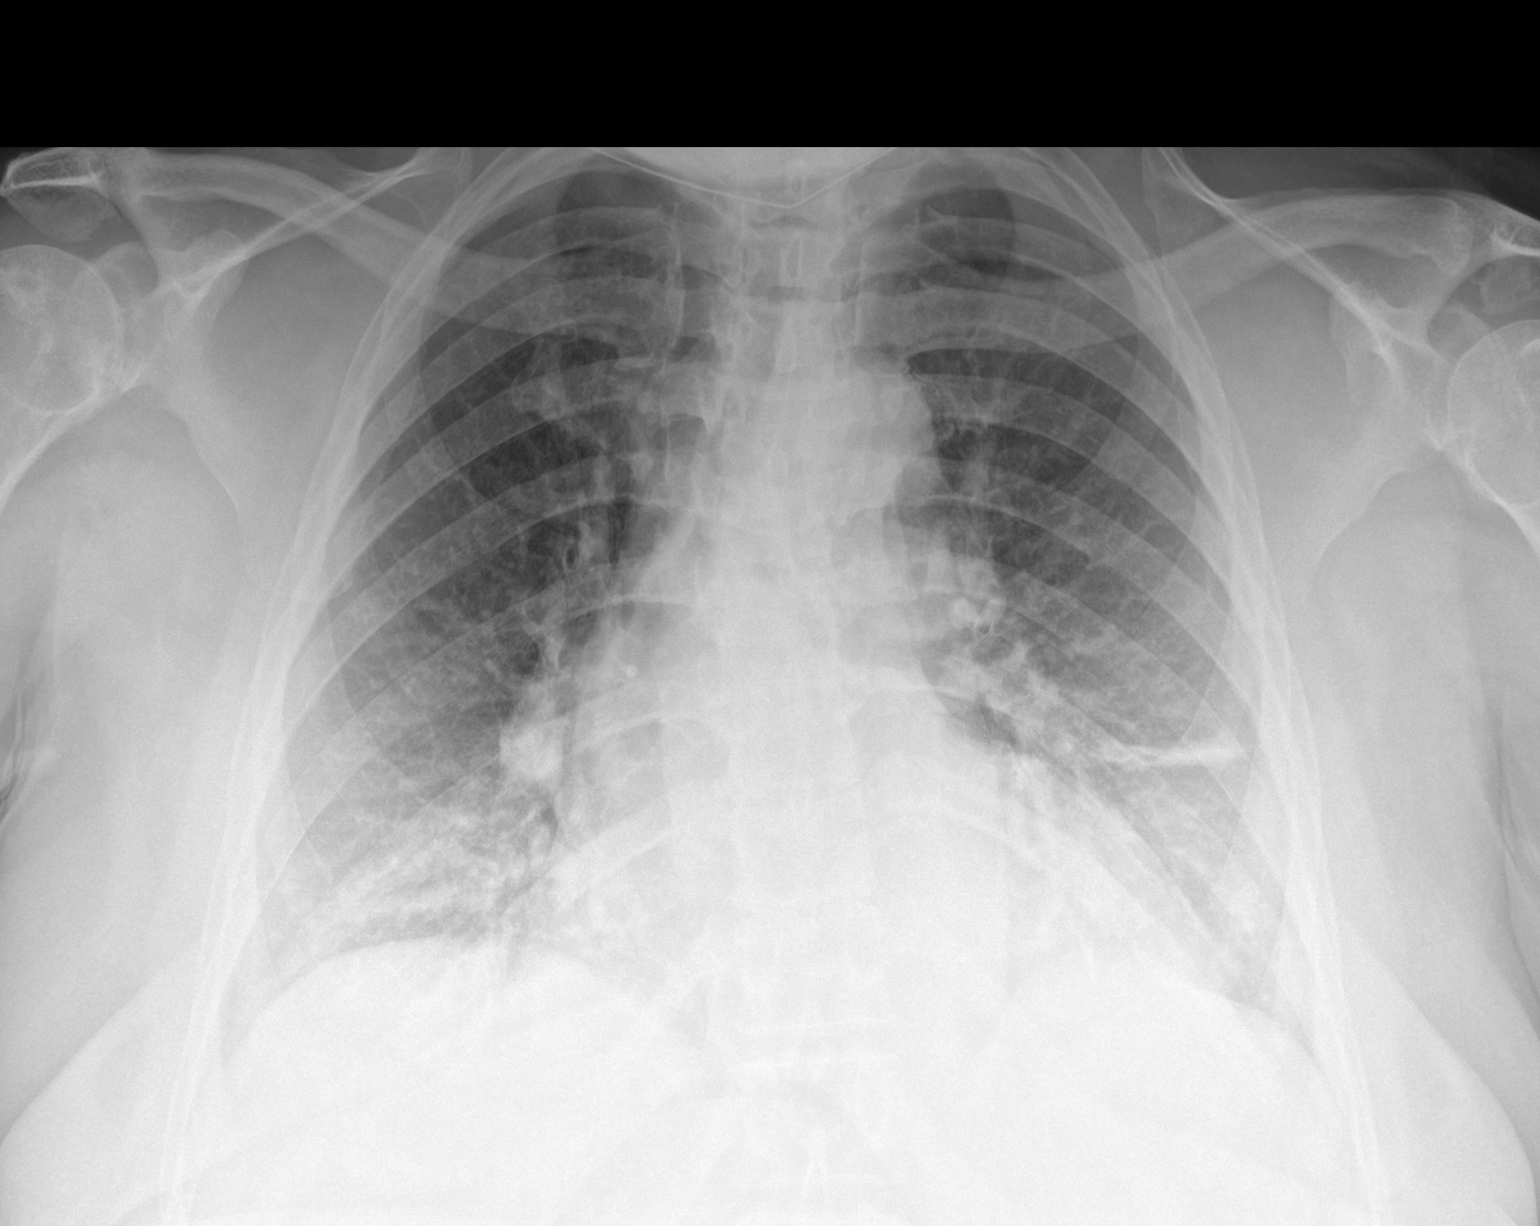

[1 of 1 positions shown; findings below may reference images not displayed]

FINDINGS: Areas of atelectatic change noted in each lower lobe. No frank edema
or airspace opacity. Heart upper normal in size with pulmonary
vascularity normal. No adenopathy. No bone lesions
IMPRESSION: Atelectatic change in the lower lung regions. Early developing
pneumonia in these areas cannot be entirely excluded. Lungs
otherwise clear. Heart upper normal in size.

## 2023-09-24 ENCOUNTER — Emergency Department: Payer: No Typology Code available for payment source

## 2023-09-24 ENCOUNTER — Inpatient Hospital Stay
Admission: EM | Admit: 2023-09-24 | Discharge: 2023-09-27 | DRG: 871 | Disposition: A | Discharge status: Home Health | Payer: No Typology Code available for payment source | Attending: Internal Medicine | Admitting: Internal Medicine

## 2023-09-24 ENCOUNTER — Other Ambulatory Visit: Payer: Self-pay

## 2023-09-24 DIAGNOSIS — I5033 Acute on chronic diastolic (congestive) heart failure: Secondary | ICD-10-CM | POA: Insufficient documentation

## 2023-09-24 DIAGNOSIS — Z6841 Body Mass Index (BMI) 40.0 and over, adult: Secondary | ICD-10-CM

## 2023-09-24 DIAGNOSIS — J440 Chronic obstructive pulmonary disease with acute lower respiratory infection: Secondary | ICD-10-CM | POA: Diagnosis present

## 2023-09-24 DIAGNOSIS — Z79899 Other long term (current) drug therapy: Secondary | ICD-10-CM

## 2023-09-24 DIAGNOSIS — R0902 Hypoxemia: Secondary | ICD-10-CM | POA: Diagnosis not present

## 2023-09-24 DIAGNOSIS — J441 Chronic obstructive pulmonary disease with (acute) exacerbation: Secondary | ICD-10-CM | POA: Insufficient documentation

## 2023-09-24 DIAGNOSIS — J9601 Acute respiratory failure with hypoxia: Secondary | ICD-10-CM | POA: Insufficient documentation

## 2023-09-24 DIAGNOSIS — A419 Sepsis, unspecified organism: Secondary | ICD-10-CM | POA: Diagnosis not present

## 2023-09-24 DIAGNOSIS — Z79890 Hormone replacement therapy (postmenopausal): Secondary | ICD-10-CM

## 2023-09-24 DIAGNOSIS — F17210 Nicotine dependence, cigarettes, uncomplicated: Secondary | ICD-10-CM | POA: Diagnosis present

## 2023-09-24 DIAGNOSIS — J189 Pneumonia, unspecified organism: Secondary | ICD-10-CM | POA: Diagnosis present

## 2023-09-24 DIAGNOSIS — D72829 Elevated white blood cell count, unspecified: Secondary | ICD-10-CM

## 2023-09-24 DIAGNOSIS — E039 Hypothyroidism, unspecified: Secondary | ICD-10-CM

## 2023-09-24 DIAGNOSIS — G4733 Obstructive sleep apnea (adult) (pediatric): Secondary | ICD-10-CM | POA: Insufficient documentation

## 2023-09-24 DIAGNOSIS — I422 Other hypertrophic cardiomyopathy: Secondary | ICD-10-CM | POA: Diagnosis present

## 2023-09-24 DIAGNOSIS — I509 Heart failure, unspecified: Secondary | ICD-10-CM

## 2023-09-24 DIAGNOSIS — E66813 Obesity, class 3: Secondary | ICD-10-CM | POA: Insufficient documentation

## 2023-09-24 DIAGNOSIS — I110 Hypertensive heart disease with heart failure: Secondary | ICD-10-CM | POA: Diagnosis present

## 2023-09-24 HISTORY — DX: Heart failure, unspecified: I50.9

## 2023-09-24 LAB — CBC WITH DIFFERENTIAL/PLATELET
Abs Immature Granulocytes: 0.1 10*3/uL — ABNORMAL HIGH (ref 0.00–0.07)
Basophils Absolute: 0 10*3/uL (ref 0.0–0.1)
Basophils Relative: 0 %
Eosinophils Absolute: 0.2 10*3/uL (ref 0.0–0.5)
Eosinophils Relative: 1 %
HCT: 43.3 % (ref 36.0–46.0)
Hemoglobin: 13.6 g/dL (ref 12.0–15.0)
Immature Granulocytes: 1 %
Lymphocytes Relative: 23 %
Lymphs Abs: 4.6 10*3/uL — ABNORMAL HIGH (ref 0.7–4.0)
MCH: 27.3 pg (ref 26.0–34.0)
MCHC: 31.4 g/dL (ref 30.0–36.0)
MCV: 86.8 fL (ref 80.0–100.0)
Monocytes Absolute: 0.8 10*3/uL (ref 0.1–1.0)
Monocytes Relative: 4 %
Neutro Abs: 14.1 10*3/uL — ABNORMAL HIGH (ref 1.7–7.7)
Neutrophils Relative %: 71 %
Platelets: 267 10*3/uL (ref 150–400)
RBC: 4.99 MIL/uL (ref 3.87–5.11)
RDW: 20 % — ABNORMAL HIGH (ref 11.5–15.5)
WBC: 19.7 10*3/uL — ABNORMAL HIGH (ref 4.0–10.5)
nRBC: 0 % (ref 0.0–0.2)

## 2023-09-24 LAB — COMPREHENSIVE METABOLIC PANEL
ALT: 80 U/L — ABNORMAL HIGH (ref 0–44)
AST: 50 U/L — ABNORMAL HIGH (ref 15–41)
Albumin: 3.5 g/dL (ref 3.5–5.0)
Alkaline Phosphatase: 123 U/L (ref 38–126)
Anion gap: 8 (ref 5–15)
BUN: 19 mg/dL (ref 8–23)
CO2: 32 mmol/L (ref 22–32)
Calcium: 8.9 mg/dL (ref 8.9–10.3)
Chloride: 98 mmol/L (ref 98–111)
Creatinine, Ser: 0.95 mg/dL (ref 0.44–1.00)
GFR, Estimated: >60 mL/min (ref >60)
Glucose, Bld: 167 mg/dL — ABNORMAL HIGH (ref 70–99)
Potassium: 4.2 mmol/L (ref 3.5–5.1)
Sodium: 138 mmol/L (ref 135–145)
Total Bilirubin: 0.3 mg/dL (ref <1.2)
Total Protein: 6.9 g/dL (ref 6.5–8.1)

## 2023-09-24 LAB — BRAIN NATRIURETIC PEPTIDE: B Natriuretic Peptide: 160 pg/mL — ABNORMAL HIGH (ref 0.0–100.0)

## 2023-09-24 LAB — TROPONIN I (HIGH SENSITIVITY): Troponin I (High Sensitivity): 13 ng/L (ref <18)

## 2023-09-24 MED ORDER — IPRATROPIUM-ALBUTEROL 0.5-2.5 (3) MG/3ML IN SOLN
3.0000 mL | Freq: Once | RESPIRATORY_TRACT | Status: AC
  Administered 2023-09-24: 3 mL via RESPIRATORY_TRACT
  Filled 2023-09-24: qty 3

## 2023-09-24 MED ORDER — AZITHROMYCIN 500 MG IV SOLR
500.0000 mg | INTRAVENOUS | Status: DC
  Administered 2023-09-25: 500 mg via INTRAVENOUS
  Filled 2023-09-24 (×2): qty 5

## 2023-09-24 MED ORDER — CEFTRIAXONE SODIUM 2 G IJ SOLR
2.0000 g | INTRAMUSCULAR | Status: DC
  Administered 2023-09-24 – 2023-09-26 (×3): 2 g via INTRAVENOUS
  Filled 2023-09-24 (×3): qty 20

## 2023-09-24 MED ORDER — ALBUTEROL SULFATE (2.5 MG/3ML) 0.083% IN NEBU
2.5000 mg | INHALATION_SOLUTION | RESPIRATORY_TRACT | Status: DC | PRN
  Administered 2023-09-26 (×2): 2.5 mg via RESPIRATORY_TRACT
  Filled 2023-09-24 (×2): qty 3

## 2023-09-24 NOTE — ED Provider Notes (Signed)
[facility] Provider Note    Event Date/Time   First MD Initiated Contact with Patient 09/24/23 2325     (approximate)   History   Shortness of Breath   HPI  Patricia Schneider is a 67 y.o. female who presents to the ED for evaluation of Shortness of Breath   I review ED documentation in the [place] from yesterday.  History of CHF and seen for acute pneumonia, x-ray with left basilar opacity, leukocytosis on CBC.  Discharged with antibiotics.  Patient presents with increasing shortness of breath and chest pressure.  She lives in [address] and left her antibiotics at home, has not had any since yesterday.  No syncope or falls.  She reports orthopnea, chest discomfort, cough that is productive.  No abdominal pain or emesis.  Saturations are initially okay, but she is placed on nasal cannula due to hypoxia prior to my evaluation.   Physical Exam   Triage Vital Signs: ED Triage Vitals  Encounter Vitals Group     BP 09/24/23 2046 (!) 125/92     Systolic BP Percentile --      Diastolic BP Percentile --      Pulse Rate 09/24/23 2046 (!) 111     Resp 09/24/23 2046 18     Temp 09/24/23 2046 98.5 F (36.9 C)     Temp src --      SpO2 09/24/23 2046 93 %     Weight --      Height --      Head Circumference --      Peak Flow --      Pain Score 09/24/23 2049 0     Pain Loc --      Pain Education --      Exclude from Growth Chart --     Most recent vital signs: Vitals:   09/25/23 0015 09/25/23 0031  BP:    Pulse: 89   Resp: (!) 21   Temp:  (!) 97.4 F (36.3 C)  SpO2: 93%     General: Awake, no distress.  Seems uncomfortable CV:  Good peripheral perfusion.  Resp:  Mild tachypnea without distress.  No wheezing. Abd:  No distention.  MSK:  No deformity noted.  Neuro:  No focal deficits appreciated. Other:     ED Results / Procedures / Treatments   Labs (all labs ordered are listed, but only abnormal results are displayed) Labs  Reviewed  CBC WITH DIFFERENTIAL/PLATELET - Abnormal; Notable for the following components:      Result Value   WBC 19.7 (*)    RDW 20.0 (*)    Neutro Abs 14.1 (*)    Lymphs Abs 4.6 (*)    Abs Immature Granulocytes 0.10 (*)    All other components within normal limits  COMPREHENSIVE METABOLIC PANEL - Abnormal; Notable for the following components:   Glucose, Bld 167 (*)    AST 50 (*)    ALT 80 (*)    All other components within normal limits  BRAIN NATRIURETIC PEPTIDE - Abnormal; Notable for the following components:   B Natriuretic Peptide 160.0 (*)    All other components within normal limits  CULTURE, BLOOD (ROUTINE X 2)  CULTURE, BLOOD (ROUTINE X 2)  LACTIC ACID, PLASMA  LACTIC ACID, PLASMA  PROCALCITONIN  TROPONIN I (HIGH SENSITIVITY)  TROPONIN I (HIGH SENSITIVITY)    EKG Sinus rhythm with a rate of 103 bpm.  Leftward axis.  Normal intervals.  No STEMI.  RADIOLOGY 1 view CXR interpreted by me with cardiomegaly, congestion and left basilar opacity  Official radiology report(s): DG Chest 1 View  Result Date: 09/24/2023 CLINICAL DATA:  Shortness of breath EXAM: CHEST  1 VIEW COMPARISON:  03/02/2021 FINDINGS: Mild cardiomegaly. Streaky mid to lower lung opacities. No pleural effusion or pneumothorax IMPRESSION: Mild cardiomegaly. Streaky mid to lower lung opacities, atelectasis versus atypical infection. Electronically Signed   By: Jasmine Pang M.D.   On: 09/24/2023 21:58    PROCEDURES and INTERVENTIONS:  .Critical Care  Performed by: Delton Prairie, MD Authorized by: Delton Prairie, MD   Critical care provider statement:    Critical care time (minutes):  30   Critical care time was exclusive of:  Separately billable procedures and treating other patients   Critical care was necessary to treat or prevent imminent or life-threatening deterioration of the following conditions:  Sepsis and respiratory failure   Critical care was time spent personally by me on the following  activities:  Development of treatment plan with patient or surrogate, discussions with consultants, evaluation of patient's response to treatment, examination of patient, ordering and review of laboratory studies, ordering and review of radiographic studies, ordering and performing treatments and interventions, pulse oximetry, re-evaluation of patient's condition and review of old charts   Medications  albuterol (PROVENTIL) (2.5 MG/3ML) 0.083% nebulizer solution 2.5 mg (has no administration in time range)  cefTRIAXone (ROCEPHIN) 2 g in sodium chloride 0.9 % 100 mL IVPB (0 g Intravenous Stopped 09/25/23 0004)  azithromycin (ZITHROMAX) 500 mg in sodium chloride 0.9 % 250 mL IVPB (500 mg Intravenous New Bag/Given 09/25/23 0005)  ipratropium-albuterol (DUONEB) 0.5-2.5 (3) MG/3ML nebulizer solution 3 mL (3 mLs Nebulization Given 09/24/23 2100)  furosemide (LASIX) injection 60 mg (60 mg Intravenous Given 09/25/23 0043)     IMPRESSION / MDM / ASSESSMENT AND PLAN / ED COURSE  I reviewed the triage vital signs and the nursing notes.  Differential diagnosis includes, but is not limited to, ACS, PTX, PNA, muscle strain/spasm, PE, dissection, anxiety, pleural effusion  {Patient presents with symptoms of an acute illness or injury that is potentially life-threatening.  Patient presents with chest discomfort and respiratory symptoms with evidence of sepsis from pneumonia and a CHF exacerbation requiring medical admission.  No hypoxia requiring nasal cannula.  No distress to indicate BiPAP.  Leukocytosis and tachycardia suggest sepsis.  Left basilar infiltrate.  Elevated BNP and seems congested as well as orthopneic.  We initiate antibiotics per protocol for CAP and diuresis for CHF exacerbation.  Consult with medicine for admission.      FINAL CLINICAL IMPRESSION(S) / ED DIAGNOSES   Final diagnoses:  Sepsis due to pneumonia (HCC)  Hypoxia     Rx / DC Orders   ED Discharge Orders     None         Note:  This document was prepared using Dragon voice recognition software and may include unintentional dictation errors.   Delton Prairie, MD 09/25/23 [phone]

## 2023-09-24 NOTE — Progress Notes (Signed)
CODE SEPSIS - PHARMACY COMMUNICATION  **Broad Spectrum Antibiotics should be administered within 1 hour of Sepsis diagnosis**  Time Code Sepsis Called/Page Received: 2334  Antibiotics Ordered: Azithromycin & Ceftriaxone  Time of 1st antibiotic administration: 2344  Otelia Sergeant, PharmD, [facility] 09/24/2023 11:36 PM

## 2023-09-24 NOTE — ED Triage Notes (Signed)
Pt reports SOB x 3 days. Hx of CHF. Reports compliant with diuretics at home. States has been having a wet productive cough with clear sputum production. Pt reports concern for pneumonia because the last time she felt like this that's what it was.  Symmetric chest rise and fall noted. Pt noted to have dyspnea at rest and with exertion. Abd notably swollen. No swelling noted to lower legs. Denies chest pain or pressure. Pt alert and oriented. Placed in wheelchair in triage.

## 2023-09-24 NOTE — Sepsis Progress Note (Signed)
Elink monitoring for the code sepsis protocol.  

## 2023-09-25 ENCOUNTER — Inpatient Hospital Stay
Admit: 2023-09-25 | Discharge: 2023-09-25 | Disposition: A | Discharge status: Home / Self Care | Payer: No Typology Code available for payment source | Attending: Internal Medicine | Admitting: Internal Medicine

## 2023-09-25 ENCOUNTER — Encounter: Payer: Self-pay | Admitting: Internal Medicine

## 2023-09-25 DIAGNOSIS — J189 Pneumonia, unspecified organism: Secondary | ICD-10-CM | POA: Diagnosis present

## 2023-09-25 DIAGNOSIS — I5031 Acute diastolic (congestive) heart failure: Secondary | ICD-10-CM

## 2023-09-25 DIAGNOSIS — I422 Other hypertrophic cardiomyopathy: Secondary | ICD-10-CM | POA: Diagnosis present

## 2023-09-25 DIAGNOSIS — E039 Hypothyroidism, unspecified: Secondary | ICD-10-CM | POA: Diagnosis present

## 2023-09-25 DIAGNOSIS — J441 Chronic obstructive pulmonary disease with (acute) exacerbation: Secondary | ICD-10-CM | POA: Diagnosis present

## 2023-09-25 DIAGNOSIS — J9601 Acute respiratory failure with hypoxia: Secondary | ICD-10-CM | POA: Diagnosis present

## 2023-09-25 DIAGNOSIS — R0902 Hypoxemia: Secondary | ICD-10-CM | POA: Diagnosis present

## 2023-09-25 DIAGNOSIS — F17210 Nicotine dependence, cigarettes, uncomplicated: Secondary | ICD-10-CM | POA: Diagnosis present

## 2023-09-25 DIAGNOSIS — A419 Sepsis, unspecified organism: Secondary | ICD-10-CM

## 2023-09-25 DIAGNOSIS — E66813 Obesity, class 3: Secondary | ICD-10-CM | POA: Diagnosis present

## 2023-09-25 DIAGNOSIS — I5033 Acute on chronic diastolic (congestive) heart failure: Secondary | ICD-10-CM | POA: Diagnosis present

## 2023-09-25 DIAGNOSIS — Z6841 Body Mass Index (BMI) 40.0 and over, adult: Secondary | ICD-10-CM | POA: Diagnosis not present

## 2023-09-25 DIAGNOSIS — J440 Chronic obstructive pulmonary disease with acute lower respiratory infection: Secondary | ICD-10-CM | POA: Diagnosis present

## 2023-09-25 DIAGNOSIS — G4733 Obstructive sleep apnea (adult) (pediatric): Secondary | ICD-10-CM | POA: Insufficient documentation

## 2023-09-25 DIAGNOSIS — I110 Hypertensive heart disease with heart failure: Secondary | ICD-10-CM | POA: Diagnosis present

## 2023-09-25 DIAGNOSIS — Z79890 Hormone replacement therapy (postmenopausal): Secondary | ICD-10-CM | POA: Diagnosis not present

## 2023-09-25 DIAGNOSIS — I509 Heart failure, unspecified: Secondary | ICD-10-CM | POA: Diagnosis not present

## 2023-09-25 DIAGNOSIS — Z79899 Other long term (current) drug therapy: Secondary | ICD-10-CM | POA: Diagnosis not present

## 2023-09-25 DIAGNOSIS — D72829 Elevated white blood cell count, unspecified: Secondary | ICD-10-CM

## 2023-09-25 LAB — ECHOCARDIOGRAM COMPLETE
AR max vel: 1.97 cm2
AV Area VTI: 2.59 cm2
AV Area mean vel: 2.59 cm2
AV Mean grad: 3 mm[Hg]
AV Peak grad: 6.5 mm[Hg]
Ao pk vel: 1.27 m/s
Area-P 1/2: 6.83 cm2
Height: 64 in
MV VTI: 2.27 cm2
S' Lateral: 2.6 cm
Weight: 3950.64 [oz_av]

## 2023-09-25 LAB — CBC WITH DIFFERENTIAL/PLATELET
Abs Immature Granulocytes: 0.11 10*3/uL — ABNORMAL HIGH (ref 0.00–0.07)
Basophils Absolute: 0 10*3/uL (ref 0.0–0.1)
Basophils Relative: 0 %
Eosinophils Absolute: 0.2 10*3/uL (ref 0.0–0.5)
Eosinophils Relative: 1 %
HCT: 43.4 % (ref 36.0–46.0)
Hemoglobin: 13.5 g/dL (ref 12.0–15.0)
Immature Granulocytes: 1 %
Lymphocytes Relative: 22 %
Lymphs Abs: 4.8 10*3/uL — ABNORMAL HIGH (ref 0.7–4.0)
MCH: 27.3 pg (ref 26.0–34.0)
MCHC: 31.1 g/dL (ref 30.0–36.0)
MCV: 87.7 fL (ref 80.0–100.0)
Monocytes Absolute: 1.1 10*3/uL — ABNORMAL HIGH (ref 0.1–1.0)
Monocytes Relative: 5 %
Neutro Abs: 15.5 10*3/uL — ABNORMAL HIGH (ref 1.7–7.7)
Neutrophils Relative %: 71 %
Platelets: 263 10*3/uL (ref 150–400)
RBC: 4.95 MIL/uL (ref 3.87–5.11)
RDW: 19.8 % — ABNORMAL HIGH (ref 11.5–15.5)
WBC: 21.7 10*3/uL — ABNORMAL HIGH (ref 4.0–10.5)
nRBC: 0 % (ref 0.0–0.2)

## 2023-09-25 LAB — URINALYSIS, COMPLETE (UACMP) WITH MICROSCOPIC
Bacteria, UA: NONE SEEN
Bilirubin Urine: NEGATIVE
Glucose, UA: NEGATIVE mg/dL
Hgb urine dipstick: NEGATIVE
Ketones, ur: NEGATIVE mg/dL
Leukocytes,Ua: NEGATIVE
Nitrite: NEGATIVE
Protein, ur: NEGATIVE mg/dL
Specific Gravity, Urine: 1.005 (ref 1.005–1.030)
pH: 5 (ref 5.0–8.0)

## 2023-09-25 LAB — COMPREHENSIVE METABOLIC PANEL
ALT: 84 U/L — ABNORMAL HIGH (ref 0–44)
AST: 56 U/L — ABNORMAL HIGH (ref 15–41)
Albumin: 3.6 g/dL (ref 3.5–5.0)
Alkaline Phosphatase: 119 U/L (ref 38–126)
Anion gap: 9 (ref 5–15)
BUN: 18 mg/dL (ref 8–23)
CO2: 34 mmol/L — ABNORMAL HIGH (ref 22–32)
Calcium: 8.7 mg/dL — ABNORMAL LOW (ref 8.9–10.3)
Chloride: 96 mmol/L — ABNORMAL LOW (ref 98–111)
Creatinine, Ser: 0.83 mg/dL (ref 0.44–1.00)
GFR, Estimated: >60 mL/min (ref >60)
Glucose, Bld: 119 mg/dL — ABNORMAL HIGH (ref 70–99)
Potassium: 4.1 mmol/L (ref 3.5–5.1)
Sodium: 139 mmol/L (ref 135–145)
Total Bilirubin: 0.2 mg/dL (ref <1.2)
Total Protein: 7.1 g/dL (ref 6.5–8.1)

## 2023-09-25 LAB — RESP PANEL BY RT-PCR (RSV, FLU A&B, COVID)  RVPGX2
Influenza A by PCR: NEGATIVE
Influenza B by PCR: NEGATIVE
Resp Syncytial Virus by PCR: NEGATIVE
SARS Coronavirus 2 by RT PCR: NEGATIVE

## 2023-09-25 LAB — TROPONIN I (HIGH SENSITIVITY): Troponin I (High Sensitivity): 11 ng/L (ref <18)

## 2023-09-25 LAB — PHOSPHORUS: Phosphorus: 5.3 mg/dL — ABNORMAL HIGH (ref 2.5–4.6)

## 2023-09-25 LAB — LACTIC ACID, PLASMA: Lactic Acid, Venous: 1.4 mmol/L (ref 0.5–1.9)

## 2023-09-25 LAB — STREP PNEUMONIAE URINARY ANTIGEN: Strep Pneumo Urinary Antigen: NEGATIVE

## 2023-09-25 LAB — HIV ANTIBODY (ROUTINE TESTING W REFLEX): HIV Screen 4th Generation wRfx: NONREACTIVE

## 2023-09-25 LAB — MAGNESIUM: Magnesium: 2.1 mg/dL (ref 1.7–2.4)

## 2023-09-25 LAB — CBG MONITORING, ED: Glucose-Capillary: 96 mg/dL (ref 70–99)

## 2023-09-25 LAB — BRAIN NATRIURETIC PEPTIDE: B Natriuretic Peptide: 102.9 pg/mL — ABNORMAL HIGH (ref 0.0–100.0)

## 2023-09-25 LAB — PROCALCITONIN: Procalcitonin: <0.1 ng/mL

## 2023-09-25 MED ORDER — IPRATROPIUM-ALBUTEROL 0.5-2.5 (3) MG/3ML IN SOLN: 3.0000 mL | Freq: Four times a day (QID) | RESPIRATORY_TRACT | Status: DC

## 2023-09-25 MED ORDER — ACETAMINOPHEN 325 MG PO TABS: 650.0000 mg | ORAL_TABLET | Freq: Four times a day (QID) | ORAL | Status: DC | PRN

## 2023-09-25 MED ORDER — ENOXAPARIN SODIUM 60 MG/0.6ML IJ SOSY
55.0000 mg | PREFILLED_SYRINGE | INTRAMUSCULAR | Status: DC
  Administered 2023-09-25 – 2023-09-27 (×3): 55 mg via SUBCUTANEOUS
  Filled 2023-09-25 (×3): qty 0.6

## 2023-09-25 MED ORDER — DEXTROSE 5 % IV SOLN
500.0000 mg | INTRAVENOUS | Status: DC
  Administered 2023-09-25 – 2023-09-27 (×2): 500 mg via INTRAVENOUS
  Filled 2023-09-25 (×2): qty 5

## 2023-09-25 MED ORDER — PREDNISONE 20 MG PO TABS
40.0000 mg | ORAL_TABLET | Freq: Every day | ORAL | Status: DC
  Administered 2023-09-26 – 2023-09-27 (×2): 40 mg via ORAL
  Filled 2023-09-25 (×2): qty 2

## 2023-09-25 MED ORDER — SODIUM CHLORIDE 0.9% FLUSH
3.0000 mL | Freq: Two times a day (BID) | INTRAVENOUS | Status: DC
  Administered 2023-09-25 – 2023-09-27 (×5): 3 mL via INTRAVENOUS

## 2023-09-25 MED ORDER — FLEET ENEMA RE ENEM: 1.0000 | ENEMA | Freq: Once | RECTAL | Status: DC | PRN

## 2023-09-25 MED ORDER — METOPROLOL SUCCINATE ER 25 MG PO TB24
25.0000 mg | ORAL_TABLET | Freq: Every day | ORAL | Status: DC
  Administered 2023-09-25 – 2023-09-27 (×3): 25 mg via ORAL
  Filled 2023-09-25 (×3): qty 1

## 2023-09-25 MED ORDER — FUROSEMIDE 10 MG/ML IJ SOLN
60.0000 mg | Freq: Two times a day (BID) | INTRAMUSCULAR | Status: AC
  Administered 2023-09-25 – 2023-09-26 (×4): 60 mg via INTRAVENOUS
  Filled 2023-09-25 (×4): qty 6

## 2023-09-25 MED ORDER — SORBITOL 70 % SOLN: 30.0000 mL | Freq: Every day | Status: DC | PRN

## 2023-09-25 MED ORDER — AMLODIPINE BESYLATE 5 MG PO TABS: 10.0000 mg | ORAL_TABLET | Freq: Every day | ORAL | Status: DC

## 2023-09-25 MED ORDER — LEVOTHYROXINE SODIUM 25 MCG PO TABS
25.0000 ug | ORAL_TABLET | Freq: Every day | ORAL | Status: DC
  Administered 2023-09-27: 25 ug via ORAL
  Filled 2023-09-25 (×2): qty 1

## 2023-09-25 MED ORDER — SODIUM CHLORIDE 0.9% FLUSH: 3.0000 mL | Freq: Two times a day (BID) | INTRAVENOUS | Status: DC

## 2023-09-25 MED ORDER — SODIUM CHLORIDE 0.9% FLUSH: 3.0000 mL | INTRAVENOUS | Status: DC | PRN

## 2023-09-25 MED ORDER — ONDANSETRON HCL 4 MG PO TABS: 4.0000 mg | ORAL_TABLET | Freq: Four times a day (QID) | ORAL | Status: DC | PRN

## 2023-09-25 MED ORDER — POLYETHYLENE GLYCOL 3350 17 G PO PACK
17.0000 g | PACK | Freq: Every day | ORAL | Status: DC | PRN
  Filled 2023-09-25: qty 1

## 2023-09-25 MED ORDER — FUROSEMIDE 10 MG/ML IJ SOLN: 60.0000 mg | Freq: Two times a day (BID) | INTRAMUSCULAR | Status: DC

## 2023-09-25 MED ORDER — NICOTINE 14 MG/24HR TD PT24
14.0000 mg | MEDICATED_PATCH | Freq: Every day | TRANSDERMAL | Status: DC
  Administered 2023-09-25 – 2023-09-27 (×3): 14 mg via TRANSDERMAL
  Filled 2023-09-25 (×3): qty 1

## 2023-09-25 MED ORDER — ACETAMINOPHEN 650 MG RE SUPP: 650.0000 mg | Freq: Four times a day (QID) | RECTAL | Status: DC | PRN

## 2023-09-25 MED ORDER — FUROSEMIDE 10 MG/ML IJ SOLN
60.0000 mg | Freq: Once | INTRAMUSCULAR | Status: AC
  Administered 2023-09-25: 60 mg via INTRAVENOUS
  Filled 2023-09-25: qty 8

## 2023-09-25 MED ORDER — ONDANSETRON HCL 4 MG/2ML IJ SOLN: 4.0000 mg | Freq: Four times a day (QID) | INTRAMUSCULAR | Status: DC | PRN

## 2023-09-25 MED ORDER — GUAIFENESIN ER 600 MG PO TB12
1200.0000 mg | ORAL_TABLET | Freq: Two times a day (BID) | ORAL | Status: DC
  Administered 2023-09-25 – 2023-09-27 (×5): 1200 mg via ORAL
  Filled 2023-09-25 (×5): qty 2

## 2023-09-25 MED ORDER — KETOROLAC TROMETHAMINE 15 MG/ML IJ SOLN: 15.0000 mg | Freq: Four times a day (QID) | INTRAMUSCULAR | Status: DC | PRN

## 2023-09-25 MED ORDER — FLUTICASONE PROPIONATE 50 MCG/ACT NA SUSP
2.0000 | Freq: Every day | NASAL | Status: DC
  Filled 2023-09-25: qty 16

## 2023-09-25 MED ORDER — IPRATROPIUM-ALBUTEROL 0.5-2.5 (3) MG/3ML IN SOLN
3.0000 mL | Freq: Two times a day (BID) | RESPIRATORY_TRACT | Status: DC
  Administered 2023-09-25 – 2023-09-27 (×4): 3 mL via RESPIRATORY_TRACT
  Filled 2023-09-25 (×4): qty 3

## 2023-09-25 MED ORDER — SODIUM CHLORIDE 0.9 % IV SOLN: 250.0000 mL | INTRAVENOUS | Status: DC | PRN

## 2023-09-25 MED ORDER — PANTOPRAZOLE SODIUM 40 MG PO TBEC
40.0000 mg | DELAYED_RELEASE_TABLET | Freq: Every day | ORAL | Status: DC
  Administered 2023-09-25 – 2023-09-27 (×2): 40 mg via ORAL
  Filled 2023-09-25 (×3): qty 1

## 2023-09-25 MED ORDER — HYDRALAZINE HCL 20 MG/ML IJ SOLN: 10.0000 mg | Freq: Four times a day (QID) | INTRAMUSCULAR | Status: DC | PRN

## 2023-09-25 NOTE — H&P (Signed)
History and Physical    Patricia Schneider:[id number] DOB: 04/30/1956 DOA: 09/24/2023  PCP: Pcp, No  Patient coming from: Home  I have personally briefly reviewed patient's old medical records in [facility]  Chief Complaint: Shortness of breath  HPI: Patricia Schneider is a 67 y.o. female with medical history significant of CHF, hypertension, depression, thyroid disease presenting to the ED with worsening shortness of breath.  Patient noted to have been seen in the [place] in [place] where she is from 1 day prior to presentation to the ED with complaints of shortness of breath chest x-ray done at that time consistent with a pneumonia and patient discharged on oral antibiotics.  Patient drove up to the area to be with family for the holidays however forgot her antibiotics and noted to have worsening shortness of breath with some fleeting chest pressure as well as a nonproductive cough and presented to the ED.  Patient denies any fevers, no chills, no nausea, no vomiting, no abdominal pain, no constipation, no melena, no hematemesis, no hematochezia, no syncopal episode.  Patient does endorse a bout of watery diarrhea.  Patient denies any significant lower extremity edema.  ED Course: Patient seen in the ED chest x-ray with mild cardiomegaly, streaky mid to lower lung opacities, atelectasis versus atypical infection.  SARS coronavirus 2 PCR negative, influenza A and B PCR negative, RSV by PCR negative.  CBC done with a leukocytosis with a white count of 19.7, procalcitonin negative, lactic acid 1.4, BNP noted at 160, comprehensive metabolic profile with with a glucose of 167, AST of 50, ALT of 80 otherwise within normal limits.  Patient placed on IV antibiotics due to concern for pneumonia as well as IV diuretics due to concern for acute CHF exacerbation.  Review of Systems: As per HPI otherwise all other systems reviewed and are negative.  Past Medical History:  Diagnosis Date   CHF  (congestive heart failure) (HCC)    Depression    Hypertension    Thyroid disease     Past Surgical History:  Procedure Laterality Date   HAND SURGERY      Social History  reports that she has been smoking cigarettes. She has never used smokeless tobacco. She reports that she does not drink alcohol and does not use drugs.  Allergies  Allergen Reactions   Codeine Itching, Rash and Hives   Lisinopril Itching   Prozac [Fluoxetine] Other (See Comments)    "makes me crazy"    History reviewed. No pertinent family history. Mother deceased age 85 from CHF, father deceased age 36 from COPD.  Prior to Admission medications   Medication Sig Start Date End Date Taking? Authorizing Provider  amLODipine (NORVASC) 10 MG tablet Take 10 mg by mouth daily.    [provider]  doxycycline (MONODOX) 100 MG capsule Take 1 capsule (100 mg total) by mouth 2 (two) times daily. 03/02/21   Joni Reining, PA-C  fluticasone (FLONASE) 50 MCG/ACT nasal spray Place 2 sprays into both nostrils daily. 11/25/17   Menshew, Charlesetta Ivory, PA-C  furosemide (LASIX) 40 MG tablet Take 1 tablet (40 mg total) by mouth daily. 05/12/20 05/12/21  Enid Derry, PA-C  levothyroxine (SYNTHROID) 25 MCG tablet Take 1 tablet (25 mcg total) by mouth daily before breakfast. 02/11/21   Phineas Semen, MD  losartan (COZAAR) 100 MG tablet Take 1 tablet (100 mg total) by mouth daily. 05/12/20 05/12/21  Enid Derry, PA-C  methylPREDNISolone (MEDROL DOSEPAK) 4 MG TBPK tablet Take  Tapered dose as directed 03/02/21   Joni Reining, PA-C    Physical Exam: Vitals:   09/25/23 0325 09/25/23 0400 09/25/23 0411 09/25/23 0415  BP:      Pulse: 93 (!) 101 98 (!) 102  Resp: 17 13 20 15   Temp:      TempSrc:      SpO2: 97% (!) 77% 97% 95%    Constitutional: NAD, calm, comfortable Vitals:   09/25/23 0325 09/25/23 0400 09/25/23 0411 09/25/23 0415  BP:      Pulse: 93 (!) 101 98 (!) 102  Resp: 17 13 20 15   Temp:      TempSrc:       SpO2: 97% (!) 77% 97% 95%   Eyes: PERRL, lids and conjunctivae normal ENMT: Mucous membranes are moist. Posterior pharynx clear of any exudate or lesions.Normal dentition.  Neck: normal, supple, no masses, no thyromegaly Respiratory: Some scattered coarse breath sounds.  No wheezing.  Fair air movement.  No use of accessory muscles of respiration.  Speaking in full sentences.  Cardiovascular: Tachycardia.  No murmurs rubs or gallops.  Trace to 1+ bilateral lower extremity edema.   Abdomen: Abdomen is soft, nontender, and obese, mildly distended, positive bowel sounds.  No rebound.  No guarding.  Musculoskeletal: no clubbing / cyanosis. No joint deformity upper and lower extremities. Good ROM, no contractures. Normal muscle tone.  Skin: no rashes, lesions, ulcers. No induration Neurologic: CN 2-12 grossly intact. Sensation intact, DTR normal. Strength 5/5 in all 4.  Psychiatric: Normal judgment and insight. Alert and oriented x 3. Normal mood.   Labs on Admission: I have personally reviewed following labs and imaging studies  CBC: Recent Labs  Lab 09/24/23 2052  WBC 19.7*  NEUTROABS 14.1*  HGB 13.6  HCT 43.3  MCV 86.8  PLT 267    Basic Metabolic Panel: Recent Labs  Lab 09/24/23 2052  NA 138  K 4.2  CL 98  CO2 32  GLUCOSE 167*  BUN 19  CREATININE 0.95  CALCIUM 8.9    GFR: CrCl cannot be calculated (Unknown ideal weight.).  Liver Function Tests: Recent Labs  Lab 09/24/23 2052  AST 50*  ALT 80*  ALKPHOS 123  BILITOT 0.3  PROT 6.9  ALBUMIN 3.5    Urine analysis: No results found for: "COLORURINE", "APPEARANCEUR", "LABSPEC", "PHURINE", "GLUCOSEU", "HGBUR", "BILIRUBINUR", "KETONESUR", "PROTEINUR", "UROBILINOGEN", "NITRITE", "LEUKOCYTESUR"  Radiological Exams on Admission: DG Chest 1 View  Result Date: 09/24/2023 CLINICAL DATA:  Shortness of breath EXAM: CHEST  1 VIEW COMPARISON:  03/02/2021 FINDINGS: Mild cardiomegaly. Streaky mid to lower lung opacities.  No pleural effusion or pneumothorax IMPRESSION: Mild cardiomegaly. Streaky mid to lower lung opacities, atelectasis versus atypical infection. Electronically Signed   By: Jasmine Pang M.D.   On: 09/24/2023 21:58    EKG: Independently reviewed.  Sinus tachycardia: LAFB  Assessment/Plan Principal Problem:   Sepsis (HCC) Active Problems:   CAP (community acquired pneumonia)   Acute exacerbation of CHF (congestive heart failure) (HCC)   #1 sepsis secondary to community-acquired pneumonia -Presenting worsening shortness of breath, recently diagnosed with pneumonia was being treated in outpatient setting however forgot antibiotics in [place] as she had driven here to see family for the holidays. -Patient met criteria for sepsis on admission with leukocytosis, tachycardia, known presentation to have a hypoxia. -Blood cultures ordered and pending. -Urine cultures pending. -Check a sputum Gram stain and culture. -Check a urine Legionella antigen, urine pneumococcus antigen. -IV Rocephin, IV azithromycin. -Flonase, PPI, Mucinex.  2.  Probable acute CHF exacerbation -Patient presenting worsening shortness of breath, noted to have a BNP of 160.0. -Patient on initial presentation per ED physician noted to be volume overloaded. -Patient received Lasix 60 IV x 1 on exam noted to have some trace to 1+ lower extremity edema. -Cardiac enzymes negative x 2. -Check a 2D echo. -Lasix 60 mg IV every 12 hours x 4 doses. -Strict I's and O's, daily weights.  3.  Hypothyroidism -Resume home regimen Synthroid.  4.  Leukocytosis -Likely secondary to problem #1. -Patient pancultured. -Continue empiric IV Rocephin, IV azithromycin. -  DVT prophylaxis: Lovenox Code Status:   Full Family Communication:  Updated patient.  No family at bedside. Disposition Plan:   Patient is from:  Home  Anticipated DC to:  Home  Anticipated DC date:  2 to 3 days  Anticipated DC barriers: Clinical  improvement Consults called:  None Admission status:  Admit to inpatient/telemetry  Severity of Illness: The appropriate patient status for this patient is INPATIENT. Inpatient status is judged to be reasonable and necessary in order to provide the required intensity of service to ensure the patient's safety. The patient's presenting symptoms, physical exam findings, and initial radiographic and laboratory data in the context of their chronic comorbidities is felt to place them at high risk for further clinical deterioration. Furthermore, it is not anticipated that the patient will be medically stable for discharge from the hospital within 2 midnights of admission.   * I certify that at the point of admission it is my clinical judgment that the patient will require inpatient hospital care spanning beyond 2 midnights from the point of admission due to high intensity of service, high risk for further deterioration and high frequency of surveillance required.*    Ramiro Harvest MD [facility]  How to contact the [facility] Attending or Consulting provider 7A - 7P or covering provider during after hours 7P -7A, for this patient?   Check the care team in [facility] and look for a) attending/consulting TR[facility] provider listed and b) the [facility] team listed Log into [email or web address] and use [place]'s universal password to access. If you do not have the password, please contact the hospital operator. Locate the [facility] provider you are looking for under [facility] and page to a number that you can be directly reached. If you still have difficulty reaching the provider, please page the [facility] (Director on Call) for the Hospitalists listed on amion for assistance.  09/25/2023, 4:45 AM

## 2023-09-25 NOTE — ED Notes (Signed)
Meal tray and po fluids provided.

## 2023-09-25 NOTE — ED Notes (Signed)
Continue to wait for pharmacy to verify lovenox

## 2023-09-25 NOTE — Progress Notes (Signed)
PHARMACIST - PHYSICIAN COMMUNICATION  CONCERNING:  Enoxaparin (Lovenox) for DVT Prophylaxis    RECOMMENDATION: Patient was prescribed enoxaprin 40mg  q24 hours for VTE prophylaxis.   Filed Weights   09/25/23 0451  Weight: 112 kg (246 lb 14.6 oz)    Body mass index is 42.38 kg/m.  Estimated Creatinine Clearance: 82.8 mL/min (by C-G formula based on SCr of 0.83 mg/dL).   Based on [facility] policy patient is candidate for enoxaparin 0.5mg /kg TBW SQ every 24 hours based on BMI being >30.  DESCRIPTION: Pharmacy has adjusted enoxaparin dose per [facility] policy.  Patient is now receiving enoxaparin 0.5 mg/kg every 24 hours   Otelia Sergeant, PharmD, [facility] 09/25/2023 5:49 AM

## 2023-09-25 NOTE — ED Notes (Signed)
This RN spoke with pt's daughter Karel Jarvis, and updated her on POC. Pt. Confirms Karel Jarvis is allowed to get information.

## 2023-09-25 NOTE — ED Notes (Signed)
Pox in low 80s. Pt placed on oxygen at 3lpm via [place].

## 2023-09-25 NOTE — ED Notes (Signed)
Pt took off her O2. Pt c/o needing it off due to it bothering her nose. Pt sating @ 96% on RA. NAD noted.

## 2023-09-25 NOTE — Hospital Course (Addendum)
Patricia Schneider is a 67 y.o. female with medical history significant of CHF, hypertension, depression, thyroid disease presenting to the ED with worsening shortness of breath.  Patient had a weight gain of 10 pounds in the past 2 weeks, leg edema, paroxysmal active dyspnea as well as orthopnea. Patient also has large amount of mucus production. Upon arriving the hospital, patient had a secondary tachycardia 102, leukocytosis up to 21.  Oxygen saturation 82%. Chest x-ray showed a possible atypical pneumonia. Patient was started on antibiotics, IV Lasix for CHF exacerbation. Condition improved, short of breath much better.  No longer has hypoxia, home oxygen evaluation was obtained, oxygen saturation was 90% with ambulation on room air, 98% on room air at rest.  No need for home oxygen.  Medically stable for discharge.

## 2023-09-25 NOTE — ED Notes (Signed)
Report to [employer], rn

## 2023-09-25 NOTE — ED Notes (Signed)
Hospitalist at bedside 

## 2023-09-25 NOTE — ED Notes (Signed)
Report to elena, rn.

## 2023-09-25 NOTE — ED Notes (Signed)
RT at bedside.

## 2023-09-25 NOTE — Progress Notes (Signed)
  Echocardiogram 2D Echocardiogram has been performed.  Patricia Schneider 09/25/2023, 1:18 PM

## 2023-09-25 NOTE — ED Notes (Signed)
Advised nurse that patient has ready bed 

## 2023-09-25 NOTE — ED Notes (Signed)
Waiting on pharmacy to verify lovenox

## 2023-09-25 NOTE — Plan of Care (Signed)
Problem: Education: Goal: Knowledge of General Education information will improve Description: Including pain rating scale, medication(s)/side effects and non-pharmacologic comfort measures Outcome: Progressing   Problem: Health Behavior/Discharge Planning: Goal: Ability to manage health-related needs will improve Outcome: Progressing   Problem: Clinical Measurements: Goal: Ability to maintain clinical measurements within normal limits will improve Outcome: Progressing Goal: Will remain free from infection Outcome: Progressing Goal: Diagnostic test results will improve Outcome: Progressing Goal: Respiratory complications will improve Outcome: Progressing Goal: Cardiovascular complication will be avoided Outcome: Progressing   Problem: Activity: Goal: Risk for activity intolerance will decrease Outcome: Progressing   Problem: Nutrition: Goal: Adequate nutrition will be maintained Outcome: Progressing   Problem: Coping: Goal: Level of anxiety will decrease Outcome: Progressing   Problem: Elimination: Goal: Will not experience complications related to bowel motility Outcome: Progressing Goal: Will not experience complications related to urinary retention Outcome: Progressing   Problem: Pain Management: Goal: General experience of comfort will improve Outcome: Progressing   Problem: Safety: Goal: Ability to remain free from injury will improve Outcome: Progressing   Problem: Skin Integrity: Goal: Risk for impaired skin integrity will decrease Outcome: Progressing   Problem: Education: Goal: Ability to demonstrate management of disease process will improve Outcome: Progressing Goal: Ability to verbalize understanding of medication therapies will improve Outcome: Progressing Goal: Individualized Educational Video(s) Outcome: Progressing   Problem: Activity: Goal: Capacity to carry out activities will improve Outcome: Progressing   Problem: Cardiac: Goal:  Ability to achieve and maintain adequate cardiopulmonary perfusion will improve Outcome: Progressing   Problem: Activity: Goal: Ability to tolerate increased activity will improve Outcome: Progressing   Problem: Clinical Measurements: Goal: Ability to maintain a body temperature in the normal range will improve Outcome: Progressing   Problem: Respiratory: Goal: Ability to maintain adequate ventilation will improve Outcome: Progressing Goal: Ability to maintain a clear airway will improve Outcome: Progressing

## 2023-09-25 NOTE — Progress Notes (Signed)
RT called to place pt on BiPAP due to pt desat while sleiping

## 2023-09-25 NOTE — ED Notes (Signed)
Pt repeatedly removing cpap. Cpap replaced multiple times.

## 2023-09-25 NOTE — ED Notes (Signed)
Pt placed back on oxygen at 2lpm via [place] for pox of 89% at rest.

## 2023-09-25 NOTE — ED Notes (Addendum)
This RN to bedside, pt. Awake, alert and oriented. Pt. Personal items repositioned per pt. Request. Pt. Placed back on 2L [place] from c-pap. Pt. Eating breakfast. Provided cordless phone per request. Pt. Is very unhappy with her wait in ED. This RN empathizes, and explains delay in pt. Being moved to floor. Pt. States she wants to leave. This RN explains to pt. That when that when she is off oxygen, her O2 saturation drops drastically. Pt. Persists that she wants to go home, this RN will notify Dr. Chipper Herb. Pt. Eating breakfast independently, arranging table herself.

## 2023-09-25 NOTE — Progress Notes (Signed)
  Progress Note   Patient: Patricia Schneider WUJ:[id number] DOB: 03-18-1956 DOA: 09/24/2023     0 DOS: the patient was seen and examined on 09/25/2023   Brief hospital course: Patricia Schneider is a 67 y.o. female with medical history significant of CHF, hypertension, depression, thyroid disease presenting to the ED with worsening shortness of breath.  Patient had a weight gain of 10 pounds in the past 2 weeks, leg edema, paroxysmal active dyspnea as well as orthopnea. Patient also has large amount of mucus production. Upon arriving the hospital, patient had a secondary tachycardia 102, leukocytosis up to 21.  Oxygen saturation 82%. Chest x-ray showed a possible atypical pneumonia. Patient was started on antibiotics, IV Lasix for CHF exacerbation.     Principal Problem:   Sepsis (HCC) Active Problems:   CAP (community acquired pneumonia)   Acute exacerbation of CHF (congestive heart failure) (HCC)   Hypothyroidism   Leukocytosis   Assessment and Plan: Acute respiratory failure with hypoxemia. Appear to be multifactorial, acute exacerbation congestive heart failure, pneumonia, COPD exacerbation, and sleep apnea. Patient still has significant bronchospasm, added scheduled bronchodilator as well as prednisone. Continue oxygen with treatment.  COPD exacerbation. Atypical pneumonia. Sepsis secondary to pneumonia. Tobacco abuse. Patient still has significant bronchospasm, started prednisone, scheduled bronchodilator. Continue antibiotics with Rocephin and Zithromax.  Procalcitonin level not significant elevated.  Patient met sepsis criteria with significant tachycardia as well as leukocytosis. Will also obtain speech therapy evaluation. Advised to quit tobacco.  Acute on chronic diastolic congestive heart failure. Hypertrophic cardiomyopathy. Reviewed records from outside source, patient has diagnosis of hypertrophic cardiomyopathy.  Obtain echocardiogram. Patient clearly has volume  overload, with significant short of breath, weight gain, paroxysmal active dyspnea as well as orthopnea.  Patient also had a saline leg edema. Continue IV Lasix started yesterday. Discontinue patient home amlodipine, started metoprolol.  Morbid obesity with BMI 42.38. Obstructive sleep apnea. Start CPAP tonight.  Diet exercise advised.      Subjective:  Patient still complaining short of breath and wheezing, large amount of mucus production with cough.  Physical Exam: Vitals:   09/25/23 1015 09/25/23 1046 09/25/23 1057 09/25/23 1100  BP:  (!) 169/95    Pulse: 96 84 92   Resp: 15 16 14    Temp:  98.7 F (37.1 C)    TempSrc:  Oral    SpO2: 95% 90% 98% 94%  Weight:      Height:       General exam: Appears calm and comfortable, morbid obesity. Respiratory system: Wheezes and crackles in the base. Respiratory effort normal. Cardiovascular system: S1 & S2 heard, RRR. No JVD, murmurs, rubs, gallops or clicks. Gastrointestinal system: Abdomen is nondistended, soft and nontender. No organomegaly or masses felt. Normal bowel sounds heard. Central nervous system: Alert and oriented. No focal neurological deficits. Extremities: 1+ leg edema improving. Skin: No rashes, lesions or ulcers Psychiatry: Judgement and insight appear normal. Mood & affect appropriate.    Data Reviewed:  Reviewed chest x-ray results, lab results, Reviewed records from [place].  Family Communication: None  Disposition: Status is: Inpatient Remains inpatient appropriate because: Severity of disease, IV treatment.     Time spent: 90 minutes  Author: Marrion Coy, MD 09/25/2023 11:50 AM  For on call review [email or web address].

## 2023-09-25 NOTE — ED Notes (Signed)
Pt with sats in 70s and low 80s while sleeping, attempted increasing oxygen to 6lpm via  without improvement in sats. Dr. York Cerise notified. Pt with history of sleep apnea. RT notiifed of order for bipap.

## 2023-09-25 NOTE — ED Notes (Signed)
Dr. Chipper Herb at bedside.

## 2023-09-25 NOTE — ED Notes (Addendum)
This RN to bedside for pt's oxygen saturation reading 78%. Pt. Is sleeping with mouth open, nasal canula in place. This RN told pt. That if she is going to nap, she needs to wear cpap for her oxygen saturation. Pt. Refused to put on cpap, and states, "I don't want it." This RN explained to pt. That her oxygen saturation drops when she is sleeping and that is very dangerous. Pt. Insists, "I won't wear it, I don't want it." Nasal Cannula placed back on pt. At 2L will continue to monitor pt's oxygen saturation. Pt's oxygen saturation recovers to 90's when awake. Pt. States she is hungry, this RN offers graham crackers or saltines and drink, pt. Does not respond.

## 2023-09-26 DIAGNOSIS — J441 Chronic obstructive pulmonary disease with (acute) exacerbation: Secondary | ICD-10-CM

## 2023-09-26 DIAGNOSIS — I5033 Acute on chronic diastolic (congestive) heart failure: Secondary | ICD-10-CM

## 2023-09-26 DIAGNOSIS — J9601 Acute respiratory failure with hypoxia: Secondary | ICD-10-CM | POA: Diagnosis not present

## 2023-09-26 DIAGNOSIS — J189 Pneumonia, unspecified organism: Secondary | ICD-10-CM | POA: Diagnosis not present

## 2023-09-26 LAB — CBC
HCT: 45.7 % (ref 36.0–46.0)
Hemoglobin: 14 g/dL (ref 12.0–15.0)
MCH: 27 pg (ref 26.0–34.0)
MCHC: 30.6 g/dL (ref 30.0–36.0)
MCV: 88.1 fL (ref 80.0–100.0)
Platelets: 257 10*3/uL (ref 150–400)
RBC: 5.19 MIL/uL — ABNORMAL HIGH (ref 3.87–5.11)
RDW: 19.7 % — ABNORMAL HIGH (ref 11.5–15.5)
WBC: 18.9 10*3/uL — ABNORMAL HIGH (ref 4.0–10.5)
nRBC: 0 % (ref 0.0–0.2)

## 2023-09-26 LAB — BASIC METABOLIC PANEL
Anion gap: 11 (ref 5–15)
BUN: 26 mg/dL — ABNORMAL HIGH (ref 8–23)
CO2: 35 mmol/L — ABNORMAL HIGH (ref 22–32)
Calcium: 9.1 mg/dL (ref 8.9–10.3)
Chloride: 92 mmol/L — ABNORMAL LOW (ref 98–111)
Creatinine, Ser: 0.88 mg/dL (ref 0.44–1.00)
GFR, Estimated: >60 mL/min (ref >60)
Glucose, Bld: 75 mg/dL (ref 70–99)
Potassium: 4.3 mmol/L (ref 3.5–5.1)
Sodium: 138 mmol/L (ref 135–145)

## 2023-09-26 LAB — GLUCOSE, CAPILLARY
Glucose-Capillary: 192 mg/dL — ABNORMAL HIGH (ref 70–99)
Glucose-Capillary: 253 mg/dL — ABNORMAL HIGH (ref 70–99)

## 2023-09-26 LAB — URINE CULTURE: Culture: 40000 — AB

## 2023-09-26 LAB — MAGNESIUM: Magnesium: 2.3 mg/dL (ref 1.7–2.4)

## 2023-09-26 NOTE — Evaluation (Signed)
Clinical/Bedside Swallow Evaluation Patient Details  Name: Patricia Schneider MRN: [id number] Date of Birth: 01/26/1956  Today's Date: 09/26/2023 Time: SLP Start Time (ACUTE ONLY): 1026 SLP Stop Time (ACUTE ONLY): 1038 SLP Time Calculation (min) (ACUTE ONLY): 12 min  Past Medical History:  Past Medical History:  Diagnosis Date   CHF (congestive heart failure) (HCC)    Depression    Hypertension    Thyroid disease    Past Surgical History:  Past Surgical History:  Procedure Laterality Date   HAND SURGERY     HPI:  67 y.o. female with medical history significant of CHF, hypertension, depression, thyroid disease presenting to the ED with worsening shortness of breath.  Patient had a weight gain of 10 pounds in the past 2 weeks, leg edema, paroxysmal active dyspnea as well as orthopnea.    Assessment / Plan / Recommendation  Clinical Impression  Pt seen for clinical swallowing evaluation. Pt alert, cooperative. Denies dysphagia. Pt demonstrated an intact oral swallow, pharyngeal swallow appeared [facility] per clinical assessment. Recommend continuation of a regular diet with thin liquids with standard aspiration precautions. SLP to sign off as pt has no acute SLP needs. SLP Visit Diagnosis: Dysphagia, unspecified (R13.10)    Aspiration Risk  Mild aspiration risk    Diet Recommendation Regular;Thin liquid    Liquid Administration via: Spoon;Cup;Straw Medication Administration:  (as tolerated) Postural Changes:  (upright for POs)    Other  Recommendations Oral Care Recommendations: Oral care BID;Patient independent with oral care    Recommendations for follow up therapy are one component of a multi-disciplinary discharge planning process, led by the attending physician.  Recommendations may be updated based on patient status, additional functional criteria and insurance authorization.  Follow up Recommendations No SLP follow up         Functional Status Assessment Patient has not had  a recent decline in their functional status         Prognosis Prognosis for improved oropharyngeal function: Good      Swallow Study   General Date of Onset: 09/24/23 (adm date) HPI: 67 y.o. female with medical history significant of CHF, hypertension, depression, thyroid disease presenting to the ED with worsening shortness of breath.  Patient had a weight gain of 10 pounds in the past 2 weeks, leg edema, paroxysmal active dyspnea as well as orthopnea. Type of Study: Bedside Swallow Evaluation Previous Swallow Assessment: none Diet Prior to this Study: Regular;Thin liquids (Level 0) Temperature Spikes Noted: Yes Respiratory Status: Nasal cannula (1L/min) History of Recent Intubation: No Behavior/Cognition: Alert;Cooperative Oral Cavity Assessment: Within Functional Limits Oral Care Completed by SLP: Yes Oral Cavity - Dentition: Adequate natural dentition Vision: Functional for self-feeding Self-Feeding Abilities: Able to feed self Patient Positioning: Upright in bed Baseline Vocal Quality: Normal Volitional Cough: Strong Volitional Swallow: Able to elicit    Oral/Motor/Sensory Function Overall Oral Motor/Sensory Function: Within functional limits   Ice Chips Ice chips: Not tested   Thin Liquid Thin Liquid: Within functional limits Presentation: Straw    Nectar Thick Nectar Thick Liquid: Not tested   Honey Thick Honey Thick Liquid: Not tested   Puree Puree: Not tested   Solid     Solid: Within functional limits Presentation: Self Fed     Clyde Canterbury, M.S., CCC-SLP Speech-Language Pathologist [facility] [phone] Arnette Felts)  Alessandra Bevels Derrion Tritz 09/26/2023,11:02 AM

## 2023-09-26 NOTE — Evaluation (Signed)
Occupational Therapy Evaluation Patient Details Name: Patricia Schneider MRN: [id number] DOB: 1956/03/26 Today's Date: 09/26/2023   History of Present Illness Pt is a 67 year old female presenting to the ED with SOB; admitted with acute respiratory failure wit hypoxemia, COPD exacerbation, Atypical pneumonia.  Sepsis secondary to pneumonia, Acute on chronic diastolic congestive heart failure.      PMH significant for CHF, hypertension, depression, thyroid disease   Clinical Impression   Chart reviewed, pt greeted in bed, initially lethargic but improved cognition following mobility. Mild safety awareness deficits noted throughout. PTA pt reports she is generally MOD I-I in ADL/IADL, amb with PRN use of AD (pt reports she has chronic back issues). Pt presents with deficits in activity tolerance, balance, affecting safe and optimal ADL completion. Functional transfers completed with supervision with intermittent vcs for safety as pt reports urgent need to urinate. Pt will benefit from acute OT to address deficits and to facilitate optimal ADL completion. Pt is left in chair, all needs met. Nurse aware of pt status.      If plan is discharge home, recommend the following: A little help with bathing/dressing/bathroom;Assist for transportation;Help with stairs or ramp for entrance;Direct supervision/assist for medications management;Direct supervision/assist for financial management;Assistance with cooking/housework    Functional Status Assessment  Patient has had a recent decline in their functional status and demonstrates the ability to make significant improvements in function in a reasonable and predictable amount of time.  Equipment Recommendations  BSC/3in1    Recommendations for Other Services       Precautions / Restrictions Precautions Precautions: Fall Restrictions Weight Bearing Restrictions: No      Mobility Bed Mobility               General bed mobility comments: NT in  chair pre/post session    Transfers Overall transfer level: Needs assistance Equipment used: None Transfers: Sit to/from Stand Sit to Stand: Supervision                  Balance Overall balance assessment: Needs assistance Sitting-balance support: Feet supported Sitting balance-Leahy Scale: Normal     Standing balance support: Bilateral upper extremity supported Standing balance-Leahy Scale: Good                             ADL either performed or assessed with clinical judgement   ADL Overall ADL's : Needs assistance/impaired Eating/Feeding: Set up;Sitting   Grooming: Wash/dry hands;Standing;Contact guard assist       Lower Body Bathing: Contact guard assist;Sit to/from stand       Lower Body Dressing: Minimal assistance;Sitting/lateral leans   Toilet Transfer: Supervision/safety;Rolling walker (2 wheels);Cueing for Actor and Hygiene: Sitting/lateral lean;Contact guard assist       Functional mobility during ADLs: Supervision/safety;Contact guard assist;Rolling walker (2 wheels);Cueing for safety (approx 20' two attempts; pt requiring frequent vcs for pacing to bathroom due to urgency)       Vision Patient Visual Report: No change from baseline       Perception         Praxis         Pertinent Vitals/Pain Pain Assessment Pain Assessment: No/denies pain     Extremity/Trunk Assessment Upper Extremity Assessment Upper Extremity Assessment: Right hand dominant;RUE deficits/detail RUE Deficits / Details: baseline nerve damage, noted claw hand with weak grip strength; RUE Coordination: decreased fine motor   Lower Extremity Assessment Lower Extremity Assessment: Overall  WFL for tasks assessed   Cervical / Trunk Assessment Cervical / Trunk Assessment: Normal   Communication Communication Communication: Difficulty following commands/understanding Following commands: Follows one  step commands consistently Cueing Techniques: Verbal cues   Cognition Arousal: Alert Behavior During Therapy: WFL for tasks assessed/performed Overall Cognitive Status: No family/caregiver present to determine baseline cognitive functioning Area of Impairment: Safety/judgement, Awareness, Problem solving, Following commands                       Following Commands: Follows one step commands consistently Safety/Judgement: Decreased awareness of deficits Awareness: Emergent Problem Solving: Difficulty sequencing, Requires verbal cues, Requires tactile cues General Comments: falling asleep initially during conversation with therapist at start of session, improved after mobility; fair safety awareness while navigating novel environment     General Comments  spo2 >90% on 1 L via [place] throughout    Exercises Other Exercises Other Exercises: edu re: role of OT, role of rehab, energy conservation techniques   Shoulder Instructions      Home Living Family/patient expects to be discharged to:: Private residence Living Arrangements: Non-relatives/Friends Available Help at Discharge: Available PRN/intermittently Type of Home: Apartment Home Access: Stairs to enter [employer] of Steps: 12 Entrance Stairs-Rails:  (yes) Home Layout: One level     Bathroom Shower/Tub: Chief Strategy Officer: Standard Bathroom Accessibility: Yes   Home Equipment: Cane - single point;Rolling Walker (2 wheels)          Prior Functioning/Environment Prior Level of Function : Independent/Modified Independent;Working/employed;Driving             Mobility Comments: PRN use of AD home/community distances ADLs Comments: MOD I with ADL/IADL, reports increased time/compensatory methods due to back pain at times        OT Problem List: Decreased activity tolerance;Decreased knowledge of use of DME or AE;Cardiopulmonary status limiting activity      OT  Treatment/Interventions: Self-care/ADL training;DME and/or AE instruction;Therapeutic activities;Therapeutic exercise;Balance training;Energy conservation;Patient/family education    OT Goals(Current goals can be found in the care plan section) Acute Rehab OT Goals Patient Stated Goal: go home OT Goal Formulation: With patient Time For Goal Achievement: 10/10/23 Potential to Achieve Goals: Good ADL Goals Pt Will Perform Grooming: with modified independence;sitting;standing Pt Will Perform Lower Body Dressing: with modified independence;sit to/from stand Pt Will Transfer to Toilet: with modified independence;ambulating Pt Will Perform Toileting - Clothing Manipulation and hygiene: with modified independence;sit to/from stand  OT Frequency: Min 1X/week    Co-evaluation              AM-PAC OT "6 Clicks" Daily Activity     Outcome Measure Help from another person eating meals?: None Help from another person taking care of personal grooming?: None Help from another person toileting, which includes using toliet, bedpan, or urinal?: None Help from another person bathing (including washing, rinsing, drying)?: A Little Help from another person to put on and taking off regular upper body clothing?: None Help from another person to put on and taking off regular lower body clothing?: A Little 6 Click Score: 22   End of Session Equipment Utilized During Treatment: Rolling walker (2 wheels) Nurse Communication: Mobility status  Activity Tolerance: Patient tolerated treatment well Patient left: in chair;with call bell/phone within reach  OT Visit Diagnosis: Other abnormalities of gait and mobility (R26.89)                Time: 1111-1130 OT Time Calculation (min): 19 min Charges:  OT General Charges $OT Visit: 1 Visit OT Evaluation $OT Eval Low Complexity: 1 Low  Oleta Mouse, OTD OTR/L  09/26/23, 12:39 PM

## 2023-09-26 NOTE — Evaluation (Addendum)
Physical Therapy Evaluation Patient Details Name: Patricia Schneider MRN: [id number] DOB: 1955-11-19 Today's Date: 09/26/2023  History of Present Illness  67 y.o. female with medical history significant of CHF, hypertension, depression, thyroid disease presenting to the ED with worsening shortness of breath.  Patient had a weight gain of 10 pounds in the past 2 weeks, leg edema, paroxysmal active dyspnea as well as orthopnea.  Clinical Impression  Pt willing to work with PT but does endorse some acute on chronic back pain with activity.  On arrival she was on 2L with SpO2 staying in the high 90s, bed activity and PLOF gathering on room air sitting EOB with SpO2 generally staying in the mid 90s.  She was able to slowly but safely circumambulate the nurses' station w/o AD (baseline forward flexed, slow gait) with SpO2 slowly dropping during the effort (as low as 83%, generally climbing back to ~90 with focused breathing and standing rest break).  PT followed with a chair but pt determined not to sit; did not, however, wish to trial steps 2/2 fatigue/breathing.  Pt moving functionally close to her baseline but with significant activity tolerance/O2 issues with prolonged activity.  Reapplied O2 to 1L post session with SpO2 quickly back to mid 90s at rest, nursing aware.  Pt will benefit from continued PT to address functional limitations and work back to [facility] for safe d/c.        If plan is discharge home, recommend the following: Help with stairs or ramp for entrance   Can travel by private vehicle        Equipment Recommendations None recommended by PT  Recommendations for Other Services       Functional Status Assessment Patient has had a recent decline in their functional status and demonstrates the ability to make significant improvements in function in a reasonable and predictable amount of time.     Precautions / Restrictions Precautions Precautions: Fall Restrictions Weight Bearing  Restrictions: No      Mobility  Bed Mobility Overal bed mobility: Independent             General bed mobility comments: appropriate use of rails    Transfers Overall transfer level: Independent Equipment used: None               General transfer comment: rises to standing without hesitation    Ambulation/Gait Ambulation/Gait assistance: Contact guard assist Gait Distance (Feet): 200 Feet Assistive device: None         General Gait Details: Pt was able to circumambulate the nurses' station but maintained a (baseline) forward flexed posture and did need a few brief standing rest breaks.  She wished to ambulate w/o O2, SpO2 dropped from mid 90s at rest to as low as 83% and she did endorse signficant fatigue but states that her actual gait is not far from baseline  Stairs Stairs:  (pt too fatigued with basic flat walking to trial stairs today)          Wheelchair Mobility     Tilt Bed    Modified Rankin (Stroke Patients Only)       Balance Overall balance assessment: Independent                                           Pertinent Vitals/Pain Pain Assessment Pain Assessment: 0-10 Pain Score: 3  Pain Location: chronic back pain  Home Living Family/patient expects to be discharged to:: Private residence Living Arrangements: Non-relatives/Friends Available Help at Discharge: Available PRN/intermittently (essentially lives independently of roommate) Type of Home: Apartment Home Access: Stairs to enter Entrance Stairs-Rails:  (yes) Entrance Stairs-Number of Steps: 12   Home Layout: One level Home Equipment: Cane - single Librarian, academic (2 wheels) (rarely needs/uses)      Prior Function Prior Level of Function : Independent/Modified Independent;Working/employed;Driving             Mobility Comments: reports that she is able to drive and run errands etc w/o AD ADLs Comments: Indepdent runs errands, etc      Extremity/Trunk Assessment   Upper Extremity Assessment Upper Extremity Assessment: Overall WFL for tasks assessed    Lower Extremity Assessment Lower Extremity Assessment: Overall WFL for tasks assessed       Communication   Communication Communication: No apparent difficulties  Cognition Arousal: Alert Behavior During Therapy: WFL for tasks assessed/performed Overall Cognitive Status: Within Functional Limits for tasks assessed                                          General Comments General comments (skin integrity, edema, etc.): Pt was eager to see how she did on room air, SpO2 steadily dropped with activity; back to mid 90s quickly on 1L at rest    Exercises     Assessment/Plan    PT Assessment Patient needs continued PT services  PT Problem List Decreased activity tolerance;Decreased knowledge of use of DME;Decreased safety awareness;Cardiopulmonary status limiting activity;Pain       PT Treatment Interventions DME instruction;Gait training;Stair training;Functional mobility training;Therapeutic activities;Therapeutic exercise;Balance training;Patient/family education    PT Goals (Current goals can be found in the Care Plan section)  Acute Rehab PT Goals Patient Stated Goal: get breathing better and back home to [facility] PT Goal Formulation: With patient Time For Goal Achievement: 10/09/23 Potential to Achieve Goals: Good    Frequency Min 1X/week     Co-evaluation               AM-PAC PT "6 Clicks" Mobility  Outcome Measure Help needed turning from your back to your side while in a flat bed without using bedrails?: None Help needed moving from lying on your back to sitting on the side of a flat bed without using bedrails?: None Help needed moving to and from a bed to a chair (including a wheelchair)?: None Help needed standing up from a chair using your arms (e.g., wheelchair or bedside chair)?: None Help needed to walk in hospital  room?: A Little Help needed climbing 3-5 steps with a railing? : A Little 6 Click Score: 22    End of Session Equipment Utilized During Treatment: Gait belt;Oxygen (3L on arrival, 2L post session, RN aware) Activity Tolerance: Patient limited by fatigue Patient left: in chair;with call bell/phone within reach Nurse Communication: Mobility status PT Visit Diagnosis: Muscle weakness (generalized) (M62.81);Difficulty in walking, not elsewhere classified (R26.2)    Time: 6644-0347 PT Time Calculation (min) (ACUTE ONLY): 19 min   Charges:   PT Evaluation $PT Eval Low Complexity: 1 Low PT Treatments $Gait Training: 8-22 mins PT General Charges $$ ACUTE PT VISIT: 1 Visit         Malachi Pro, DPT 09/26/2023, 10:37 AM

## 2023-09-26 NOTE — Progress Notes (Signed)
Progress Note   Patient: Patricia Schneider LKG:[id number] DOB: Jul 10, 1956 DOA: 09/24/2023     1 DOS: the patient was seen and examined on 09/26/2023   Brief hospital course: Patricia Schneider is a 67 y.o. female with medical history significant of CHF, hypertension, depression, thyroid disease presenting to the ED with worsening shortness of breath.  Patient had a weight gain of 10 pounds in the past 2 weeks, leg edema, paroxysmal active dyspnea as well as orthopnea. Patient also has large amount of mucus production. Upon arriving the hospital, patient had a secondary tachycardia 102, leukocytosis up to 21.  Oxygen saturation 82%. Chest x-ray showed a possible atypical pneumonia. Patient was started on antibiotics, IV Lasix for CHF exacerbation.     Principal Problem:   Sepsis (HCC) Active Problems:   CAP (community acquired pneumonia)   Acute exacerbation of CHF (congestive heart failure) (HCC)   Hypothyroidism   Leukocytosis   COPD with acute exacerbation (HCC)   Acute on chronic diastolic CHF (congestive heart failure) (HCC)   Acute hypoxemic respiratory failure (HCC)   OSA (obstructive sleep apnea)   Obesity, Class III, BMI 40-49.9 (morbid obesity) (HCC)   Assessment and Plan: Acute respiratory failure with hypoxemia. Appear to be multifactorial, acute exacerbation congestive heart failure, pneumonia, COPD exacerbation, and sleep apnea. Patient still has significant bronchospasm, added scheduled bronchodilator as well as prednisone. Oxygenation improving, will wean off oxygen.   COPD exacerbation. Atypical pneumonia. Sepsis secondary to pneumonia. Tobacco abuse. Patient still has significant bronchospasm, started prednisone, scheduled bronchodilator. Continue antibiotics with Rocephin and Zithromax.  Procalcitonin level not significant elevated.  Patient met sepsis criteria with significant tachycardia as well as leukocytosis. Patient is seen by speech therapy, no evidence  of aspiration. Continue current treatment with steroids, scheduled bronchodilator.  Continue antibiotics to complete a 5-day course.   Acute on chronic diastolic congestive heart failure. Hypertrophic cardiomyopathy. Reviewed records from outside source, patient has diagnosis of hypertrophic cardiomyopathy.  Echocardiogram here showed ejection fraction 65 to 70% with grade 1 diastolic dysfunction. Patient clearly has volume overload, with significant short of breath, weight gain, paroxysmal active dyspnea as well as orthopnea.  Patient also had a significant leg edema. Discontinue patient home amlodipine, started metoprolol. Patient condition seem to be improving, short of breath is better.  Continue another day of IV Lasix.   Morbid obesity with BMI 42.38. Obstructive sleep apnea. Discussed with RN, will need to make sure patient is on CPAP tonight.        Subjective:  Patient with diarrhea significantly, doing better today.  Still short of breath with exertion, but much better.  Cough is less productive.  Physical Exam: Vitals:   09/26/23 0451 09/26/23 0630 09/26/23 0957 09/26/23 1049  BP: (!) 151/74  132/74   Pulse: 83  92   Resp: 18  17   Temp: 98.7 F (37.1 C)  98.3 F (36.8 C)   TempSrc: Oral     SpO2: 91% 95% 100% 98%  Weight:      Height:       General exam: Appears calm and comfortable, morbid obese. Respiratory system: Decreased breathing sounds without crackles or wheezes today. Respiratory effort normal. Cardiovascular system: S1 & S2 heard, RRR. No JVD, murmurs, rubs, gallops or clicks. No pedal edema. Gastrointestinal system: Abdomen is nondistended, soft and nontender. No organomegaly or masses felt. Normal bowel sounds heard. Central nervous system: Alert and oriented. No focal neurological deficits. Extremities: Symmetric 5 x 5 power. Skin: No rashes, lesions or  ulcers Psychiatry: Judgement and insight appear normal. Mood & affect appropriate.    Data  Reviewed:  Lab results reviewed.  Family Communication: None  Disposition: Status is: Inpatient Remains inpatient appropriate because: Severity of disease, IV treatment.     Time spent: 35 minutes  Author: Marrion Coy, MD 09/26/2023 11:57 AM  For on call review [email or web address].

## 2023-09-27 DIAGNOSIS — J441 Chronic obstructive pulmonary disease with (acute) exacerbation: Secondary | ICD-10-CM | POA: Diagnosis not present

## 2023-09-27 DIAGNOSIS — J9601 Acute respiratory failure with hypoxia: Secondary | ICD-10-CM | POA: Diagnosis not present

## 2023-09-27 DIAGNOSIS — I5033 Acute on chronic diastolic (congestive) heart failure: Secondary | ICD-10-CM | POA: Diagnosis not present

## 2023-09-27 DIAGNOSIS — J189 Pneumonia, unspecified organism: Secondary | ICD-10-CM | POA: Diagnosis not present

## 2023-09-27 LAB — CBC
HCT: 42.5 % (ref 36.0–46.0)
Hemoglobin: 13.4 g/dL (ref 12.0–15.0)
MCH: 26.6 pg (ref 26.0–34.0)
MCHC: 31.5 g/dL (ref 30.0–36.0)
MCV: 84.5 fL (ref 80.0–100.0)
Platelets: 237 10*3/uL (ref 150–400)
RBC: 5.03 MIL/uL (ref 3.87–5.11)
RDW: 19.3 % — ABNORMAL HIGH (ref 11.5–15.5)
WBC: 21.4 10*3/uL — ABNORMAL HIGH (ref 4.0–10.5)
nRBC: 0 % (ref 0.0–0.2)

## 2023-09-27 LAB — BASIC METABOLIC PANEL
Anion gap: 8 (ref 5–15)
BUN: 20 mg/dL (ref 8–23)
CO2: 35 mmol/L — ABNORMAL HIGH (ref 22–32)
Calcium: 8.7 mg/dL — ABNORMAL LOW (ref 8.9–10.3)
Chloride: 93 mmol/L — ABNORMAL LOW (ref 98–111)
Creatinine, Ser: 0.7 mg/dL (ref 0.44–1.00)
GFR, Estimated: >60 mL/min (ref >60)
Glucose, Bld: 98 mg/dL (ref 70–99)
Potassium: 4.2 mmol/L (ref 3.5–5.1)
Sodium: 136 mmol/L (ref 135–145)

## 2023-09-27 LAB — MAGNESIUM: Magnesium: 2.4 mg/dL (ref 1.7–2.4)

## 2023-09-27 LAB — GLUCOSE, CAPILLARY: Glucose-Capillary: 84 mg/dL (ref 70–99)

## 2023-09-27 MED ORDER — AZITHROMYCIN 250 MG PO TABS: 500.0000 mg | ORAL_TABLET | Freq: Every day | ORAL | Status: DC

## 2023-09-27 MED ORDER — PREDNISONE 20 MG PO TABS: ORAL_TABLET | ORAL | 0 refills | Status: AC

## 2023-09-27 MED ORDER — ALBUTEROL SULFATE HFA 108 (90 BASE) MCG/ACT IN AERS
2.0000 | INHALATION_SPRAY | Freq: Four times a day (QID) | RESPIRATORY_TRACT | 0 refills | Status: AC | PRN
Start: 2023-09-27 — End: ?

## 2023-09-27 MED ORDER — AZITHROMYCIN 250 MG PO TABS: 500.0000 mg | ORAL_TABLET | Freq: Every day | ORAL | 0 refills | Status: AC

## 2023-09-27 NOTE — TOC Transition Note (Signed)
Transition of Care [facility]) - CM/SW Discharge Note   Patient Details  Name: Patricia Schneider MRN: [id number] Date of Birth: Jun 10, 1956  Transition of Care [facility]) CM/SW Contact:  Bing Quarry, RN Phone Number: 09/27/2023, 10:06 AM   Clinical Narrative:  09/25/23: Patient admitted 09/24/23 to [facility] ED for SOB. From [place], drove up here to visit daughter for thanksgiving before becoming ill. Was seen at [facility] 1 day pts with c/o SOB, was given ABx for PNA.   Significant for PMH of CHF, hypertension, depression, thyroid disease presenting to the ED with worsening shortness of breath. Patient had a weight gain of 10 pounds in the past 2 weeks, & leg edema. Code Sepsis in ED, ABx started and diuretics for concern for acute CHF issues.   Today: Patient is medically stable for discharge per provider and DC to home with HH/DME orders.   Spoke with patient regarding discharge plan and that patient will need to go through [place] in [place] to get [facility] secured. Patient stated that she will also get her Shower Chair through [place] in [place]. Patient could use a walk in shower as she states she is unable to get in and out of tube/shower without crawling in and out. Encouraged her to speak to [facility] regarding these issues for safety at home reasons as well as whatever [facility] agency is secure.  Patient verbalized understanding of these issues and discharge plan regarding HH and DME. Patient plans to drive herself back to her daughter for a few hours after discharge and then to drive back to [place] today, she stated she feels well enough to do this. Updated provider and Unit RN on this.   Per Care Everywhere:  Cardiologist: Delena Bali, MD  [phone] Last seen 10/32/24.   PCP: Barnie Del [address] [place], [place] [address] [facility] [place], [place] [facility]   Gabriel Cirri MSN RN [facility]  Care Management Department.  [place]  [facility] Direct Dial:  [phone] Main Office Phone: [phone] Weekends Only              Patient Goals and CMS Choice      Discharge Placement                         Discharge Plan and Services Additional resources added to the After Visit Summary for                                       Social Determinants of Health (SDOH) Interventions SDOH Screenings   Food Insecurity: No Food Insecurity (09/25/2023)  Housing: Low Risk  (09/25/2023)  Transportation Needs: No Transportation Needs (09/25/2023)  Utilities: Not At Risk (09/25/2023)  Financial Resource Strain: High Risk (12/19/2022)   Received from [facility]  Physical Activity: Inactive (12/19/2022)   [facility] from [facility]  Social Connections: Socially Isolated (12/19/2022)   Received from [facility]  Stress: No Stress Concern Present (12/19/2022)   [facility] from [facility]  Tobacco Use: High Risk (09/25/2023)  Health Literacy: Adequate Health Literacy (12/19/2022)   Received from [facility] [place] [facility]     Readmission Risk Interventions     No data to display

## 2023-09-27 NOTE — Progress Notes (Signed)
Transition of Care [facility]) - Inpatient Brief Assessment   Patient Details  Name: Patricia Schneider MRN: [id number] Date of Birth: 1956-04-11  Transition of Care [facility]) CM/SW Contact:    Bing Quarry, RN Phone Number: 09/27/2023, 10:55 AM   Clinical Narrative: 11/30:Updated transition note with brief assessment notes.   Cardiologist: Delena Bali, MD  [phone] [facility].    Follow up with PCP: Barnie Del [address] [place], [place] [address] [facility] [place], [facility]  Emergency Contact: Darcella Gasman (Daughter) [phone] (Home Phone)   Transition of Care Asessment: Insurance and Status: Insurance coverage has been reviewed Patient has primary care physician: Yes ([facility] [place], noted in discharge note.) Home environment has been reviewed: From [place], lives in an aparment with roommate but independent of roommate.  There are 12 steps to enter aparment per PT notes but one level once in apartment. Prior level of function:: Independent/Modified Independent;Working/employed;Driving   Independent runs errands, etc. Drivng self home on discharge. Prior/Current Home Services: No current home services Social Determinants of Health Reivew: SDOH reviewed no interventions necessary Readmission risk has been reviewed: Yes Transition of care needs: transition of care needs identified, TOC will continue to follow (Discussed discharge plan with patient and how will need to go through [place] PCP for [facility] once home and for DME shower chair. Patient verbalized understanding.)    Barriers to Discharge: Barriers Resolved  Patient Goals and CMS Choice  Expected Discharge Plan and Services  Expected Discharge Date: 09/27/23               DME Arranged: N/A (Patient will acquire DME shower/tub chair [facility] DME Agency: NA    HH Arranged: NA Will arrange via PCP in [place].  HH Agency: NA   Prior  Living Arrangements/Services: One level apartment but has 12 steps to apartment.    Activities of Daily Living   ADL Screening (condition at time of admission) Independently performs ADLs?: No Does the patient have a NEW difficulty with bathing/dressing/toileting/self-feeding that is expected to last >3 days?: No Does the patient have a NEW difficulty with getting in/out of bed, walking, or climbing stairs that is expected to last >3 days?: No Does the patient have a NEW difficulty with communication that is expected to last >3 days?: No Is the patient deaf or have difficulty hearing?: No Does the patient have difficulty seeing, even when wearing glasses/contacts?: No Does the patient have difficulty concentrating, remembering, or making decisions?: No  Permission Sought/Granted  Emotional Assessment  Admission diagnosis:  Hypoxia [R09.02] Sepsis (HCC) [A41.9] Sepsis due to pneumonia (HCC) [J18.9, A41.9] Patient Active Problem List   Diagnosis Date Noted   Obesity, Class III, BMI 40-49.9 (morbid obesity) (HCC) 09/26/2023   Sepsis (HCC) 09/25/2023   CAP (community acquired pneumonia) 09/25/2023   Acute exacerbation of CHF (congestive heart failure) (HCC) 09/25/2023   Hypothyroidism 09/25/2023   Leukocytosis 09/25/2023   COPD with acute exacerbation (HCC) 09/25/2023   Acute on chronic diastolic CHF (congestive heart failure) (HCC) 09/25/2023   Acute hypoxemic respiratory failure (HCC) 09/25/2023   OSA (obstructive sleep apnea) 09/25/2023   Cardiologist: Delena Bali, MD  [phone] Last seen 08/28/23. [facility].    Follow up with PCP: Barnie Del [address] [place], [place] [address] [facility] [place], [facility]    Pharmacy: While in [place], [place] only.   [facility] [address] [place] -  [address] [place] [address] Phone: [phone] Fax: [phone]  Social Determinants of Health  (SDOH) Interventions: None identified that TOC would intervene with.   Readmission Risk Interventions: RA risk 11%: Has PCP and Cardiologist in [facility]. [facility] insurance.      No data to display

## 2023-09-27 NOTE — Progress Notes (Signed)
SATURATION QUALIFICATIONS: (This note is used to comply with regulatory documentation for home oxygen)  Patient Saturations on Room Air at Rest = 98%  Patient Saturations on Room Air while Ambulating = 90%  Patient Saturations on 2 Liters of oxygen while Ambulating = 95%

## 2023-09-27 NOTE — Discharge Summary (Signed)
Physician Discharge Summary   Patient: Patricia Schneider MRN: [id number] DOB: 26-Oct-1956  Admit date:     09/24/2023  Discharge date: 09/27/23  Discharge Physician: Marrion Coy   PCP: Pcp, No   Recommendations at discharge:   Follow-up with PCP in [place].  Discharge Diagnoses: Principal Problem:   Sepsis (HCC) Active Problems:   CAP (community acquired pneumonia)   Acute exacerbation of CHF (congestive heart failure) (HCC)   Hypothyroidism   Leukocytosis   COPD with acute exacerbation (HCC)   Acute on chronic diastolic CHF (congestive heart failure) (HCC)   Acute hypoxemic respiratory failure (HCC)   OSA (obstructive sleep apnea)   Obesity, Class III, BMI 40-49.9 (morbid obesity) (HCC)  Resolved Problems:   * No resolved hospital problems. [facility] Course: [name] is a 67 y.o. female with medical history significant of CHF, hypertension, depression, thyroid disease presenting to the ED with worsening shortness of breath.  Patient had a weight gain of 10 pounds in the past 2 weeks, leg edema, paroxysmal active dyspnea as well as orthopnea. Patient also has large amount of mucus production. Upon arriving the hospital, patient had a secondary tachycardia 102, leukocytosis up to 21.  Oxygen saturation 82%. Chest x-ray showed a possible atypical pneumonia. Patient was started on antibiotics, IV Lasix for CHF exacerbation. Condition improved, short of breath much better.  No longer has hypoxia, home oxygen evaluation was obtained, oxygen saturation was 90% with ambulation on room air, 98% on room air at rest.  No need for home oxygen.  Medically stable for discharge.    Assessment and Plan: Acute respiratory failure with hypoxemia. Appear to be multifactorial, acute exacerbation congestive heart failure, pneumonia, COPD exacerbation, and sleep apnea. Patient still has significant bronchospasm, added scheduled bronchodilator as well as prednisone. Condition had  improved, good saturation on room air.   COPD exacerbation. Atypical pneumonia. Sepsis secondary to pneumonia. Tobacco abuse. Patient still has significant bronchospasm, started prednisone, scheduled bronchodilator. Continue antibiotics with Rocephin and Zithromax.  Procalcitonin level not significant elevated.  Patient met sepsis criteria with significant tachycardia as well as leukocytosis. Patient is seen by speech therapy, no evidence of aspiration. Continue current treatment with steroids, scheduled bronchodilator.   Condition finally improved, short of breath is resolved.  Will continue as needed albuterol.  Finish up prednisone taper.  Patient will be prescribed additional 5 days of Zithromax as patient most likely has atypical pneumonia not typical pneumonia.   Acute on chronic diastolic congestive heart failure. Hypertrophic cardiomyopathy. Reviewed records from outside source, patient has diagnosis of hypertrophic cardiomyopathy.  Echocardiogram here showed ejection fraction 65 to 70% with grade 1 diastolic dysfunction. Patient clearly has volume overload, with significant short of breath, weight gain, paroxysmal active dyspnea as well as orthopnea.  Patient also had a significant leg edema. Patient received 2 days IV Lasix, volume status much better.  Continue oral dose of Lasix.  Patient was on Coreg and losartan, will continue.   Morbid obesity with BMI 42.38. Obstructive sleep apnea. Patient was advised to use CPAP at home.          Consultants: None Procedures performed: None  Disposition: Home health Diet recommendation:  Discharge Diet Orders (From admission, onward)     Start     Ordered   09/27/23 0000  Diet - low sodium heart healthy        09/27/23 0938           Cardiac diet DISCHARGE MEDICATION: Allergies as of  09/27/2023       Reactions   Codeine Itching, Rash, Hives   Lisinopril Itching   Prozac [fluoxetine] Other (See Comments)   "makes me  crazy"        Medication List     STOP taking these medications    doxycycline 100 MG capsule Commonly known as: MONODOX   methylPREDNISolone 4 MG Tbpk tablet Commonly known as: MEDROL DOSEPAK       TAKE these medications    albuterol 108 (90 Base) MCG/ACT inhaler Commonly known as: VENTOLIN HFA Inhale 2 puffs into the lungs every 6 (six) hours as needed for wheezing or shortness of breath.   amLODipine 10 MG tablet Commonly known as: NORVASC Take 10 mg by mouth daily.   azithromycin 250 MG tablet Commonly known as: ZITHROMAX Take 2 tablets (500 mg total) by mouth daily for 3 days.   Camzyos 2.5 MG Caps capsule Generic drug: mavacamten Take 2.5 mg by mouth daily.   carvedilol 3.125 MG tablet Commonly known as: COREG Take 3.125 mg by mouth 2 (two) times daily with a meal. TAKE ONE TABLET BY MOUTH TWICE A DAY FOR HEART FAILURE   Farxiga 10 MG Tabs tablet Generic drug: dapagliflozin propanediol Take 10 mg by mouth daily.   fluticasone 50 MCG/ACT nasal spray Commonly known as: Flonase Place 2 sprays into both nostrils daily.   furosemide 40 MG tablet Commonly known as: Lasix Take 1 tablet (40 mg total) by mouth daily.   levothyroxine 25 MCG tablet Commonly known as: Synthroid Take 1 tablet (25 mcg total) by mouth daily before breakfast.   losartan 100 MG tablet Commonly known as: Cozaar Take 1 tablet (100 mg total) by mouth daily.   predniSONE 20 MG tablet Commonly known as: DELTASONE Take 1 tablet (20 mg total) by mouth daily with breakfast for 3 days, THEN 0.5 tablets (10 mg total) daily with breakfast for 3 days. Start taking on: September 28, 2023               Durable Medical Equipment  (From admission, onward)           Start     Ordered   09/27/23 0934  For home use only DME Shower stool  Once        09/27/23 0933            Discharge Exam: Filed Weights   09/25/23 0451  Weight: 112 kg   General exam: Appears calm and  comfortable, morbidly obese. Respiratory system: Clear to auscultation. Respiratory effort normal. Cardiovascular system: S1 & S2 heard, RRR. No JVD, murmurs, rubs, gallops or clicks. No pedal edema. Gastrointestinal system: Abdomen is nondistended, soft and nontender. No organomegaly or masses felt. Normal bowel sounds heard. Central nervous system: Alert and oriented. No focal neurological deficits. Extremities: Symmetric 5 x 5 power. Skin: No rashes, lesions or ulcers Psychiatry: Judgement and insight appear normal. Mood & affect appropriate.    Condition at discharge: good  The results of significant diagnostics from this hospitalization (including imaging, microbiology, ancillary and laboratory) are listed below for reference.   Imaging Studies: ECHOCARDIOGRAM COMPLETE  Result Date: 09/25/2023    ECHOCARDIOGRAM REPORT   Patient Name:   BEAU COSTANTINO Date of Exam: 09/25/2023 Medical Rec #:  [id number]        Height:       64.0 in Accession #:    [id number]       Weight:       246.9 lb Date  of Birth:  03-Aug-1956        BSA:          2.140 m Patient Age:    67 years         BP:           125/92 mmHg Patient Gender: F                HR:           93 bpm. Exam Location:  [facility] Procedure: 2D Echo, Cardiac Doppler and Color Doppler Indications:     CHF-Acute Diastolic I50.31  History:         Patient has no prior history of Echocardiogram examinations.                  CHF; Risk Factors:Hypertension.  Sonographer:     Neysa Bonito Roar Referring Phys:  [id number] Newcomer THOMPSON Diagnosing Phys: Yvonne Kendall MD IMPRESSIONS  1. Left ventricular ejection fraction, by estimation, is 65 to 70%. The left ventricle has normal function. The left ventricle has no regional wall motion abnormalities. There is moderate left ventricular hypertrophy. Left ventricular diastolic parameters are consistent with Grade I diastolic dysfunction (impaired relaxation).  2. Right ventricular systolic function is normal. The  right ventricular size is normal. There is normal pulmonary artery systolic pressure.  3. The mitral valve is grossly normal. No evidence of mitral valve regurgitation. No evidence of mitral stenosis.  4. The aortic valve was not well visualized. Aortic valve regurgitation is not visualized. No aortic stenosis is present.  5. The inferior vena cava is normal in size with greater than 50% respiratory variability, suggesting right atrial pressure of 3 mmHg. FINDINGS  Left Ventricle: Left ventricular ejection fraction, by estimation, is 65 to 70%. The left ventricle has normal function. The left ventricle has no regional wall motion abnormalities. The left ventricular internal cavity size was normal in size. There is  moderate left ventricular hypertrophy. Left ventricular diastolic parameters are consistent with Grade I diastolic dysfunction (impaired relaxation). Right Ventricle: The right ventricular size is normal. No increase in right ventricular wall thickness. Right ventricular systolic function is normal. There is normal pulmonary artery systolic pressure. The tricuspid regurgitant velocity is 2.42 m/s, and  with an assumed right atrial pressure of 3 mmHg, the estimated right ventricular systolic pressure is 26.4 mmHg. Left Atrium: Left atrial size was normal in size. Right Atrium: Right atrial size was normal in size. Pericardium: There is no evidence of pericardial effusion. Mitral Valve: The mitral valve is grossly normal. No evidence of mitral valve regurgitation. No evidence of mitral valve stenosis. MV peak gradient, 2.7 mmHg. The mean mitral valve gradient is 1.0 mmHg. Tricuspid Valve: The tricuspid valve is not well visualized. Tricuspid valve regurgitation is trivial. Aortic Valve: The aortic valve was not well visualized. Aortic valve regurgitation is not visualized. No aortic stenosis is present. Aortic valve mean gradient measures 3.0 mmHg. Aortic valve peak gradient measures 6.5 mmHg. Aortic valve  area, by VTI measures 2.59 cm. Pulmonic Valve: The pulmonic valve was not well visualized. Pulmonic valve regurgitation is not visualized. No evidence of pulmonic stenosis. Aorta: The aortic root is normal in size and structure. Pulmonary Artery: The pulmonary artery is not well seen. Venous: The inferior vena cava is normal in size with greater than 50% respiratory variability, suggesting right atrial pressure of 3 mmHg. IAS/Shunts: The interatrial septum was not well visualized.  LEFT VENTRICLE PLAX 2D LVIDd:  3.90 cm   Diastology LVIDs:         2.60 cm   LV e' medial:    8.92 cm/s LV PW:         1.30 cm   LV E/e' medial:  5.8 LV IVS:        1.50 cm   LV e' lateral:   7.62 cm/s LVOT diam:     1.80 cm   LV E/e' lateral: 6.8 LV SV:         43 LV SV Index:   20 LVOT Area:     2.54 cm  RIGHT VENTRICLE RV Basal diam:  2.90 cm RV Mid diam:    2.60 cm RV S prime:     13.20 cm/s TAPSE (M-mode): 2.0 cm LEFT ATRIUM             Index        RIGHT ATRIUM           Index LA diam:        3.50 cm 1.64 cm/m   RA Area:     16.30 cm LA Vol (A2C):   43.2 ml 20.19 ml/m  RA Volume:   37.00 ml  17.29 ml/m LA Vol (A4C):   52.5 ml 24.54 ml/m LA Biplane Vol: 47.6 ml 22.25 ml/m  AORTIC VALVE                    PULMONIC VALVE AV Area (Vmax):    1.97 cm     PV Vmax:        0.79 m/s AV Area (Vmean):   2.59 cm     PV Peak grad:   2.5 mmHg AV Area (VTI):     2.59 cm     RVOT Peak grad: 2 mmHg AV Vmax:           127.00 cm/s AV Vmean:          73.500 cm/s AV VTI:            0.165 m AV Peak Grad:      6.5 mmHg AV Mean Grad:      3.0 mmHg LVOT Vmax:         98.30 cm/s LVOT Vmean:        74.700 cm/s LVOT VTI:          0.168 m LVOT/AV VTI ratio: 1.02  AORTA Ao Root diam: 1.90 cm Ao Asc diam:  2.90 cm MITRAL VALVE               TRICUSPID VALVE MV Area (PHT): 6.83 cm    TR Peak grad:   23.4 mmHg MV Area VTI:   2.27 cm    TR Vmax:        242.00 cm/s MV Peak grad:  2.7 mmHg MV Mean grad:  1.0 mmHg    SHUNTS MV Vmax:       0.82 m/s     Systemic VTI:  0.17 m MV Vmean:      49.7 cm/s   Systemic Diam: 1.80 cm MV Decel Time: 111 msec MV E velocity: 51.60 cm/s MV A velocity: 88.60 cm/s MV E/A ratio:  0.58 MV A Prime:    11.5 cm/s Yvonne Kendall MD Electronically signed by Yvonne Kendall MD Signature Date/Time: 09/25/2023/1:32:25 PM    Final    DG Chest 1 View  Result Date: 09/24/2023 CLINICAL DATA:  Shortness of breath EXAM: CHEST  1 VIEW COMPARISON:  03/02/2021 FINDINGS:  Mild cardiomegaly. Streaky mid to lower lung opacities. No pleural effusion or pneumothorax IMPRESSION: Mild cardiomegaly. Streaky mid to lower lung opacities, atelectasis versus atypical infection. Electronically Signed   By: Jasmine Pang M.D.   On: 09/24/2023 21:58    Microbiology: Results for orders placed or performed during the hospital encounter of 09/24/23  Blood culture (routine x 2)     Status: None (Preliminary result)   Collection Time: 09/24/23 11:48 PM   Specimen: BLOOD  Result Value Ref Range Status   Specimen Description BLOOD BLOOD RIGHT ARM  Final   Special Requests   Final    BOTTLES DRAWN AEROBIC AND ANAEROBIC Blood Culture adequate volume   Culture   Final    NO GROWTH 3 DAYS Performed at [facility], [address] [place], [place] [address]    Report Status PENDING  Incomplete  Blood culture (routine x 2)     Status: None (Preliminary result)   Collection Time: 09/24/23 11:48 PM   Specimen: BLOOD  Result Value Ref Range Status   Specimen Description BLOOD BLOOD LEFT ARM  Final   Special Requests   Final    BOTTLES DRAWN AEROBIC AND ANAEROBIC Blood Culture adequate volume   Culture   Final    NO GROWTH 3 DAYS Performed at [facility], [address] [place], [place] [address]    Report Status PENDING  Incomplete  Urine Culture (for pregnant, neutropenic or urologic patients or patients with an indwelling urinary catheter)     Status: Abnormal   Collection Time: 09/25/23  4:36 AM   Specimen: Urine,  Random  Result Value Ref Range Status   Specimen Description   Final    URINE, RANDOM Performed at [facility], [address], [place], [place] [address]    Special Requests   Final    NONE Performed at [facility], [address] [place], [place] [address]    Culture (A)  Final    40,000 COLONIES/mL DIPHTHEROIDS(CORYNEBACTERIUM SPECIES) Standardized susceptibility testing for this organism is not available. Performed at [facility], [address] [place], [place] [address]    Report Status 09/26/2023 FINAL  Final  Resp panel by RT-PCR (RSV, Flu A&B, Covid) Anterior Nasal Swab     Status: None   Collection Time: 09/25/23  4:59 AM   Specimen: Anterior Nasal Swab  Result Value Ref Range Status   SARS Coronavirus 2 by RT PCR NEGATIVE NEGATIVE Final    Comment: (NOTE) SARS-CoV-2 target nucleic acids are NOT DETECTED.  The SARS-CoV-2 RNA is generally detectable in upper respiratory specimens during the acute phase of infection. The lowest concentration of SARS-CoV-2 viral copies this assay can detect is 138 copies/mL. A negative result does not preclude SARS-Cov-2 infection and should not be used as the sole basis for treatment or other patient management decisions. A negative result may occur with  improper specimen collection/handling, submission of specimen other than nasopharyngeal swab, presence of viral mutation(s) within the areas targeted by this assay, and inadequate number of viral copies(<138 copies/mL). A negative result must be combined with clinical observations, patient history, and epidemiological information. The expected result is Negative.  Fact Sheet for Patients:  BloggerCourse.com  Fact Sheet for Healthcare Providers:  SeriousBroker.it  This test is no t yet approved or cleared by the [place] FDA and  has been authorized for detection and/or diagnosis of SARS-CoV-2  by FDA under an Emergency Use Authorization (EUA). This EUA will  remain  in effect (meaning this test can be used) for the duration of the COVID-19 declaration under Section 564(b)(1) of the Act, 21 U.S.C.section 360bbb-3(b)(1), unless the authorization is terminated  or revoked sooner.       Influenza A by PCR NEGATIVE NEGATIVE Final   Influenza B by PCR NEGATIVE NEGATIVE Final    Comment: (NOTE) The Xpert Xpress SARS-CoV-2/FLU/RSV plus assay is intended as an aid in the diagnosis of influenza from Nasopharyngeal swab specimens and should not be used as a sole basis for treatment. Nasal washings and aspirates are unacceptable for Xpert Xpress SARS-CoV-2/FLU/RSV testing.  Fact Sheet for Patients: BloggerCourse.com  Fact Sheet for Healthcare Providers: SeriousBroker.it  This test is not yet approved or cleared by the [place] FDA and has been authorized for detection and/or diagnosis of SARS-CoV-2 by FDA under an Emergency Use Authorization (EUA). This EUA will remain in effect (meaning this test can be used) for the duration of the COVID-19 declaration under Section 564(b)(1) of the Act, 21 U.S.C. section 360bbb-3(b)(1), unless the authorization is terminated or revoked.     Resp Syncytial Virus by PCR NEGATIVE NEGATIVE Final    Comment: (NOTE) Fact Sheet for Patients: BloggerCourse.com  Fact Sheet for Healthcare Providers: SeriousBroker.it  This test is not yet approved or cleared by the [place] FDA and has been authorized for detection and/or diagnosis of SARS-CoV-2 by FDA under an Emergency Use Authorization (EUA). This EUA will remain in effect (meaning this test can be used) for the duration of the COVID-19 declaration under Section 564(b)(1) of the Act, 21 U.S.C. section 360bbb-3(b)(1), unless the authorization is terminated or revoked.  Performed at  [facility], [address] [place], [place] [address]     Labs: CBC: Recent Labs  Lab 09/24/23 2052 09/25/23 0459 09/26/23 0331 09/27/23 0549  WBC 19.7* 21.7* 18.9* 21.4*  NEUTROABS 14.1* 15.5*  --   --   HGB 13.6 13.5 14.0 13.4  HCT 43.3 43.4 45.7 42.5  MCV 86.8 87.7 88.1 84.5  PLT 267 263 257 237   Basic Metabolic Panel: Recent Labs  Lab 09/24/23 2052 09/25/23 0459 09/26/23 0331 09/27/23 0549  NA 138 139 138 136  K 4.2 4.1 4.3 4.2  CL 98 96* 92* 93*  CO2 32 34* 35* 35*  GLUCOSE 167* 119* 75 98  BUN 19 18 26* 20  CREATININE 0.95 0.83 0.88 0.70  CALCIUM 8.9 8.7* 9.1 8.7*  MG  --  2.1 2.3 2.4  PHOS  --  5.3*  --   --    Liver Function Tests: Recent Labs  Lab 09/24/23 2052 09/25/23 0459  AST 50* 56*  ALT 80* 84*  ALKPHOS 123 119  BILITOT 0.3 0.2  PROT 6.9 7.1  ALBUMIN 3.5 3.6   CBG: Recent Labs  Lab 09/25/23 1127 09/26/23 0959 09/26/23 1342 09/27/23 0821  GLUCAP 96 192* 253* 84    Discharge time spent: greater than 30 minutes.  Signed: Marrion Coy, MD [facility] 09/27/2023

## 2023-09-27 NOTE — Progress Notes (Signed)
This RN provided discharge instructions and teaching to the patient. The patient verbalized and demonstrated understanding of the provided instructions. All outstanding questions resolved. R and L arm PIVs removed. Both cannulas intact. Pt tolerated well. All belongings packed and in tow. Christiane Ha, CNA wheeled patient in wheelchair to private vehicle for departure.

## 2023-09-29 LAB — LEGIONELLA PNEUMOPHILA SEROGP 1 UR AG: L. pneumophila Serogp 1 Ur Ag: NEGATIVE

## 2023-09-29 LAB — CULTURE, BLOOD (ROUTINE X 2)
Culture: NO GROWTH
Culture: NO GROWTH
Special Requests: ADEQUATE
Special Requests: ADEQUATE

## 2024-01-27 DEATH — deceased
